# Patient Record
Sex: Male | Born: 1960 | Race: Black or African American | Hispanic: No | Marital: Married | Smoking: Current every day smoker
Health system: Southern US, Community
[De-identification: ages and names within clinical notes are randomized; demographics above are authoritative.]

## PROBLEM LIST (undated history)

## (undated) DIAGNOSIS — I10 Essential (primary) hypertension: Secondary | ICD-10-CM

---

## 2006-03-21 ENCOUNTER — Emergency Department (HOSPITAL_COMMUNITY): Admission: EM | Admit: 2006-03-21 | Discharge: 2006-03-21 | Payer: Self-pay | Admitting: Emergency Medicine

## 2010-09-10 ENCOUNTER — Emergency Department (HOSPITAL_COMMUNITY): Admission: EM | Admit: 2010-09-10 | Discharge: 2010-09-10 | Payer: Self-pay | Admitting: Emergency Medicine

## 2013-11-07 ENCOUNTER — Encounter (HOSPITAL_COMMUNITY): Payer: Self-pay | Admitting: Emergency Medicine

## 2013-11-07 ENCOUNTER — Emergency Department (HOSPITAL_COMMUNITY)
Admission: EM | Admit: 2013-11-07 | Discharge: 2013-11-07 | Disposition: A | Discharge status: Home / Self Care | Payer: BC Managed Care – PPO | Source: Home / Self Care

## 2013-11-07 DIAGNOSIS — R51 Headache: Secondary | ICD-10-CM

## 2013-11-07 DIAGNOSIS — I10 Essential (primary) hypertension: Secondary | ICD-10-CM

## 2013-11-07 LAB — POCT I-STAT, CHEM 8
BUN: 6 mg/dL (ref 6–23)
Glucose, Bld: 101 mg/dL — ABNORMAL HIGH (ref 70–99)
HCT: 50 % (ref 39.0–52.0)
Hemoglobin: 17 g/dL (ref 13.0–17.0)
Potassium: 4.1 mEq/L (ref 3.5–5.1)
Sodium: 141 mEq/L (ref 135–145)

## 2013-11-07 MED ORDER — HYDROCHLOROTHIAZIDE 25 MG PO TABS: 25.0000 mg | ORAL_TABLET | Freq: Every day | ORAL | Status: DC

## 2013-11-07 NOTE — ED Provider Notes (Signed)
CSN: [id number]     Arrival date & time 11/07/13  1209 History   First MD Initiated Contact with Patient 11/07/13 [phone]     Chief Complaint  Patient presents with  . Hypertension   (Consider location/radiation/quality/duration/timing/severity/associated sxs/prior Treatment) HPI Comments: 52 y o M with a hx of HTN and recently told by dentist needs to obtain a PCP for management. Instead, went to [facility] today and told that they cannot tx his BP.  His sx's include mild, dull H/A and pressure behind eyes.  Denies Problems with V,S,H,S, diplopia, focal weakness or numbness, chest pain, dyspnea or other.    History reviewed. No pertinent past medical history. History reviewed. No pertinent past surgical history. History reviewed. No pertinent family history. History  Substance Use Topics  . Smoking status: Current Every Day Smoker  . Smokeless tobacco: Not on file  . Alcohol Use: Yes    Review of Systems  Constitutional: Negative for fever, diaphoresis, activity change, appetite change and fatigue.  Respiratory: Negative.  Negative for cough, shortness of breath and wheezing.   Cardiovascular: Negative.  Negative for chest pain, palpitations and leg swelling.  Gastrointestinal: Negative.   Genitourinary: Negative.   Skin: Negative for rash.  Neurological: Positive for headaches. Negative for dizziness, seizures, syncope and speech difficulty.  Psychiatric/Behavioral: Negative.     Allergies  Review of patient's allergies indicates no known allergies.  Home Medications   Current Outpatient Rx  Name  Route  Sig  Dispense  Refill  . hydrochlorothiazide (HYDRODIURIL) 25 MG tablet   Oral   Take 1 tablet (25 mg total) by mouth daily.   30 tablet   0    BP 192/108  Pulse 72  Temp(Src) 98.6 F (37 C) (Oral)  Resp 16  SpO2 100% Physical Exam  Nursing note and vitals reviewed. Constitutional: He is oriented to person, place, and time. He appears well-developed and  well-nourished. No distress.  HENT:  Head: Normocephalic and atraumatic.  Mouth/Throat: Oropharynx is clear and moist. No oropharyngeal exudate.  Eyes: Conjunctivae and EOM are normal.  Neck: Normal range of motion. Neck supple.  Cardiovascular: Normal rate, regular rhythm and normal heart sounds.   Pulmonary/Chest: Effort normal and breath sounds normal. No respiratory distress. He has no wheezes.  Musculoskeletal: Normal range of motion. He exhibits no edema and no tenderness.  Lymphadenopathy:    He has no cervical adenopathy.  Neurological: He is alert and oriented to person, place, and time. He has normal strength. He displays no tremor. No cranial nerve deficit or sensory deficit. He exhibits normal muscle tone. Coordination normal.  Skin: Skin is warm and dry. No rash noted.  Psychiatric: He has a normal mood and affect.    ED Course  Procedures (including critical care time) Labs Review Labs Reviewed  POCT I-STAT, CHEM 8 - Abnormal; Notable for the following:    Glucose, Bld 101 (*)    Calcium, Ion 1.41 (*)    All other components within normal limits   Imaging Review No results found.    MDM   1. HTN (hypertension)   2. Headache      Call to obtain a PCP as soon as possible HCTZ 25 mg q d. Pt told we do not have sufficient data under recent criteria to provide optimal assessment for decision on BP meds. There are adverse effects and needs a physical and more labs. Neuro and card exam unremarkable. Take BP's, record and take to your doctor.  Hayden Rasmussen, NP 11/07/13 [phone]

## 2013-11-07 NOTE — ED Provider Notes (Signed)
Medical screening examination/treatment/procedure(s) were performed by non-physician practitioner and as supervising physician I was immediately available for consultation/collaboration.  Leslee Home, M.D.  Reuben Likes, MD 11/07/13 [phone]

## 2013-11-07 NOTE — ED Notes (Signed)
Pt   Reports  He  Has  Had    Some  Headache               And  Pressure  Behind   Eyes  X  sev  Weeks  He  Reports  He  Checked  His  bp recently  And  It was  Elevated  -     He  denys  Any  History  Of  htn  And  He      denys  Any chest pain /  Shortness of  Breath

## 2014-04-07 ENCOUNTER — Encounter (INDEPENDENT_AMBULATORY_CARE_PROVIDER_SITE_OTHER): Payer: Self-pay | Admitting: Ophthalmology

## 2014-04-14 ENCOUNTER — Encounter (INDEPENDENT_AMBULATORY_CARE_PROVIDER_SITE_OTHER): Payer: BC Managed Care – PPO | Admitting: Ophthalmology

## 2014-04-14 DIAGNOSIS — H35039 Hypertensive retinopathy, unspecified eye: Secondary | ICD-10-CM

## 2014-04-14 DIAGNOSIS — H30119 Disseminated chorioretinal inflammation of posterior pole, unspecified eye: Secondary | ICD-10-CM

## 2014-04-14 DIAGNOSIS — H35359 Cystoid macular degeneration, unspecified eye: Secondary | ICD-10-CM

## 2014-04-14 DIAGNOSIS — I10 Essential (primary) hypertension: Secondary | ICD-10-CM

## 2019-02-26 ENCOUNTER — Encounter (HOSPITAL_COMMUNITY): Payer: Self-pay

## 2019-02-26 ENCOUNTER — Inpatient Hospital Stay (HOSPITAL_COMMUNITY)
Admission: EM | Admit: 2019-02-26 | Discharge: 2019-03-06 | DRG: 439 | Disposition: A | Discharge status: Home / Self Care | Payer: Self-pay | Source: Ambulatory Visit | Attending: Family Medicine | Admitting: Family Medicine

## 2019-02-26 ENCOUNTER — Emergency Department (HOSPITAL_COMMUNITY): Payer: Self-pay

## 2019-02-26 ENCOUNTER — Ambulatory Visit (INDEPENDENT_AMBULATORY_CARE_PROVIDER_SITE_OTHER): Payer: Self-pay

## 2019-02-26 ENCOUNTER — Ambulatory Visit (HOSPITAL_COMMUNITY)
Admission: EM | Admit: 2019-02-26 | Discharge: 2019-02-26 | Disposition: A | Discharge status: Home / Self Care | Payer: Self-pay | Attending: Internal Medicine | Admitting: Internal Medicine

## 2019-02-26 ENCOUNTER — Other Ambulatory Visit: Payer: Self-pay

## 2019-02-26 DIAGNOSIS — K259 Gastric ulcer, unspecified as acute or chronic, without hemorrhage or perforation: Secondary | ICD-10-CM

## 2019-02-26 DIAGNOSIS — E860 Dehydration: Secondary | ICD-10-CM | POA: Diagnosis present

## 2019-02-26 DIAGNOSIS — F1020 Alcohol dependence, uncomplicated: Secondary | ICD-10-CM | POA: Diagnosis present

## 2019-02-26 DIAGNOSIS — R634 Abnormal weight loss: Secondary | ICD-10-CM

## 2019-02-26 DIAGNOSIS — K858 Other acute pancreatitis without necrosis or infection: Secondary | ICD-10-CM

## 2019-02-26 DIAGNOSIS — R64 Cachexia: Secondary | ICD-10-CM | POA: Diagnosis present

## 2019-02-26 DIAGNOSIS — R109 Unspecified abdominal pain: Secondary | ICD-10-CM

## 2019-02-26 DIAGNOSIS — K852 Alcohol induced acute pancreatitis without necrosis or infection: Principal | ICD-10-CM | POA: Diagnosis present

## 2019-02-26 DIAGNOSIS — H5461 Unqualified visual loss, right eye, normal vision left eye: Secondary | ICD-10-CM | POA: Diagnosis present

## 2019-02-26 DIAGNOSIS — Z833 Family history of diabetes mellitus: Secondary | ICD-10-CM

## 2019-02-26 DIAGNOSIS — R1010 Upper abdominal pain, unspecified: Secondary | ICD-10-CM

## 2019-02-26 DIAGNOSIS — Z841 Family history of disorders of kidney and ureter: Secondary | ICD-10-CM

## 2019-02-26 DIAGNOSIS — R509 Fever, unspecified: Secondary | ICD-10-CM

## 2019-02-26 DIAGNOSIS — K5900 Constipation, unspecified: Secondary | ICD-10-CM | POA: Diagnosis not present

## 2019-02-26 DIAGNOSIS — Z681 Body mass index (BMI) 19 or less, adult: Secondary | ICD-10-CM

## 2019-02-26 DIAGNOSIS — I4719 Other supraventricular tachycardia: Secondary | ICD-10-CM | POA: Diagnosis present

## 2019-02-26 DIAGNOSIS — K859 Acute pancreatitis without necrosis or infection, unspecified: Secondary | ICD-10-CM

## 2019-02-26 DIAGNOSIS — R0600 Dyspnea, unspecified: Secondary | ICD-10-CM

## 2019-02-26 DIAGNOSIS — I471 Supraventricular tachycardia, unspecified: Secondary | ICD-10-CM | POA: Diagnosis present

## 2019-02-26 DIAGNOSIS — E278 Other specified disorders of adrenal gland: Secondary | ICD-10-CM

## 2019-02-26 DIAGNOSIS — H538 Other visual disturbances: Secondary | ICD-10-CM | POA: Diagnosis present

## 2019-02-26 DIAGNOSIS — N179 Acute kidney failure, unspecified: Secondary | ICD-10-CM

## 2019-02-26 DIAGNOSIS — D8683 Sarcoid iridocyclitis: Secondary | ICD-10-CM | POA: Diagnosis present

## 2019-02-26 DIAGNOSIS — H5010 Unspecified exotropia: Secondary | ICD-10-CM | POA: Diagnosis present

## 2019-02-26 DIAGNOSIS — E871 Hypo-osmolality and hyponatremia: Secondary | ICD-10-CM | POA: Diagnosis present

## 2019-02-26 DIAGNOSIS — I4892 Unspecified atrial flutter: Secondary | ICD-10-CM

## 2019-02-26 DIAGNOSIS — I248 Other forms of acute ischemic heart disease: Secondary | ICD-10-CM | POA: Diagnosis present

## 2019-02-26 DIAGNOSIS — I10 Essential (primary) hypertension: Secondary | ICD-10-CM | POA: Diagnosis present

## 2019-02-26 DIAGNOSIS — E876 Hypokalemia: Secondary | ICD-10-CM | POA: Diagnosis not present

## 2019-02-26 DIAGNOSIS — I483 Typical atrial flutter: Secondary | ICD-10-CM | POA: Diagnosis present

## 2019-02-26 DIAGNOSIS — F17200 Nicotine dependence, unspecified, uncomplicated: Secondary | ICD-10-CM | POA: Diagnosis present

## 2019-02-26 DIAGNOSIS — D72829 Elevated white blood cell count, unspecified: Secondary | ICD-10-CM

## 2019-02-26 HISTORY — DX: Essential (primary) hypertension: I10

## 2019-02-26 LAB — COMPREHENSIVE METABOLIC PANEL
ALT: 56 U/L — ABNORMAL HIGH (ref 0–44)
AST: 153 U/L — ABNORMAL HIGH (ref 15–41)
Albumin: 3.1 g/dL — ABNORMAL LOW (ref 3.5–5.0)
Alkaline Phosphatase: 103 U/L (ref 38–126)
Anion gap: 16 — ABNORMAL HIGH (ref 5–15)
BUN: 79 mg/dL — ABNORMAL HIGH (ref 6–20)
CO2: 24 mmol/L (ref 22–32)
Calcium: 10.8 mg/dL — ABNORMAL HIGH (ref 8.9–10.3)
Chloride: 91 mmol/L — ABNORMAL LOW (ref 98–111)
Creatinine, Ser: 4.2 mg/dL — ABNORMAL HIGH (ref 0.61–1.24)
GFR calc Af Amer: 17 mL/min — ABNORMAL LOW (ref >60)
GFR calc non Af Amer: 15 mL/min — ABNORMAL LOW (ref >60)
Glucose, Bld: 184 mg/dL — ABNORMAL HIGH (ref 70–99)
Potassium: 4 mmol/L (ref 3.5–5.1)
Sodium: 131 mmol/L — ABNORMAL LOW (ref 135–145)
Total Bilirubin: 2.5 mg/dL — ABNORMAL HIGH (ref 0.3–1.2)
Total Protein: 8.4 g/dL — ABNORMAL HIGH (ref 6.5–8.1)

## 2019-02-26 LAB — CBC WITH DIFFERENTIAL/PLATELET
Abs Immature Granulocytes: 0 10*3/uL (ref 0.00–0.07)
Basophils Absolute: 0 10*3/uL (ref 0.0–0.1)
Basophils Relative: 0 %
Eosinophils Absolute: 0 10*3/uL (ref 0.0–0.5)
Eosinophils Relative: 0 %
HCT: 46.9 % (ref 39.0–52.0)
Hemoglobin: 16.6 g/dL (ref 13.0–17.0)
Lymphocytes Relative: 2 %
Lymphs Abs: 0.4 10*3/uL — ABNORMAL LOW (ref 0.7–4.0)
MCH: 34.6 pg — ABNORMAL HIGH (ref 26.0–34.0)
MCHC: 35.4 g/dL (ref 30.0–36.0)
MCV: 97.7 fL (ref 80.0–100.0)
Monocytes Absolute: 0.9 10*3/uL (ref 0.1–1.0)
Monocytes Relative: 5 %
Neutro Abs: 16.6 10*3/uL — ABNORMAL HIGH (ref 1.7–7.7)
Neutrophils Relative %: 93 %
Platelets: 283 10*3/uL (ref 150–400)
RBC: 4.8 MIL/uL (ref 4.22–5.81)
RDW: 13.2 % (ref 11.5–15.5)
WBC: 17.9 10*3/uL — ABNORMAL HIGH (ref 4.0–10.5)
nRBC: 0 /100 WBC
nRBC: 0.4 % — ABNORMAL HIGH (ref 0.0–0.2)

## 2019-02-26 LAB — URINALYSIS, COMPLETE (UACMP) WITH MICROSCOPIC
Bilirubin Urine: NEGATIVE
Glucose, UA: NEGATIVE mg/dL
Ketones, ur: 5 mg/dL — AB
Leukocytes,Ua: NEGATIVE
Nitrite: NEGATIVE
Protein, ur: NEGATIVE mg/dL
Specific Gravity, Urine: 1.015 (ref 1.005–1.030)
pH: 5 (ref 5.0–8.0)

## 2019-02-26 LAB — LIPASE, BLOOD
Lipase: 37 U/L (ref 11–51)
Lipase: 29 U/L (ref 11–51)

## 2019-02-26 LAB — TROPONIN I
Troponin I: 0.06 ng/mL (ref <0.03)
Troponin I: 0.04 ng/mL (ref <0.03)
Troponin I: 0.05 ng/mL (ref <0.03)

## 2019-02-26 LAB — AMYLASE: Amylase: 71 U/L (ref 28–100)

## 2019-02-26 MED ORDER — ONDANSETRON HCL 4 MG/2ML IJ SOLN: 4.0000 mg | Freq: Four times a day (QID) | INTRAMUSCULAR | Status: DC | PRN

## 2019-02-26 MED ORDER — ADULT MULTIVITAMIN W/MINERALS CH
1.0000 | ORAL_TABLET | Freq: Every day | ORAL | Status: DC
  Administered 2019-02-27 – 2019-03-06 (×8): 1 via ORAL
  Filled 2019-02-26 (×9): qty 1

## 2019-02-26 MED ORDER — SODIUM CHLORIDE 0.9 % IV SOLN
INTRAVENOUS | Status: DC
  Administered 2019-02-26 – 2019-02-28 (×4): via INTRAVENOUS

## 2019-02-26 MED ORDER — ACETAMINOPHEN 650 MG RE SUPP: 650.0000 mg | Freq: Four times a day (QID) | RECTAL | Status: DC | PRN

## 2019-02-26 MED ORDER — PANTOPRAZOLE SODIUM 40 MG IV SOLR
40.0000 mg | Freq: Two times a day (BID) | INTRAVENOUS | Status: DC
  Administered 2019-02-26 – 2019-02-28 (×4): 40 mg via INTRAVENOUS
  Filled 2019-02-26 (×4): qty 40

## 2019-02-26 MED ORDER — DILTIAZEM LOAD VIA INFUSION
20.0000 mg | Freq: Once | INTRAVENOUS | Status: AC
  Administered 2019-02-26: 20 mg via INTRAVENOUS
  Filled 2019-02-26: qty 20

## 2019-02-26 MED ORDER — DILTIAZEM HCL-DEXTROSE 100-5 MG/100ML-% IV SOLN (PREMIX)
5.0000 mg/h | INTRAVENOUS | Status: DC
  Administered 2019-02-26: 18:00:00 5 mg/h via INTRAVENOUS
  Administered 2019-02-27: 15 mg/h via INTRAVENOUS
  Filled 2019-02-26 (×3): qty 100

## 2019-02-26 MED ORDER — ENOXAPARIN SODIUM 30 MG/0.3ML ~~LOC~~ SOLN
30.0000 mg | SUBCUTANEOUS | Status: DC
  Administered 2019-02-27: 30 mg via SUBCUTANEOUS
  Filled 2019-02-26: qty 0.3

## 2019-02-26 MED ORDER — ONDANSETRON HCL 4 MG PO TABS: 4.0000 mg | ORAL_TABLET | Freq: Four times a day (QID) | ORAL | Status: DC | PRN

## 2019-02-26 MED ORDER — THIAMINE HCL 100 MG/ML IJ SOLN
100.0000 mg | Freq: Every day | INTRAMUSCULAR | Status: DC
  Administered 2019-02-26: 100 mg via INTRAVENOUS
  Filled 2019-02-26: qty 2

## 2019-02-26 MED ORDER — DILTIAZEM HCL-DEXTROSE 100-5 MG/100ML-% IV SOLN (PREMIX)
5.0000 mg/h | INTRAVENOUS | Status: DC
  Filled 2019-02-26: qty 100

## 2019-02-26 MED ORDER — VITAMIN B-1 100 MG PO TABS
100.0000 mg | ORAL_TABLET | Freq: Every day | ORAL | Status: DC
  Administered 2019-02-27 – 2019-03-06 (×8): 100 mg via ORAL
  Filled 2019-02-26 (×8): qty 1

## 2019-02-26 MED ORDER — DILTIAZEM HCL 100 MG IV SOLR
5.0000 mg/h | INTRAVENOUS | Status: DC
  Filled 2019-02-26 (×2): qty 100

## 2019-02-26 MED ORDER — ACETAMINOPHEN 325 MG PO TABS
650.0000 mg | ORAL_TABLET | Freq: Four times a day (QID) | ORAL | Status: DC | PRN
  Administered 2019-02-28 – 2019-03-03 (×3): 650 mg via ORAL
  Filled 2019-02-26 (×3): qty 2

## 2019-02-26 MED ORDER — FOLIC ACID 1 MG PO TABS
1.0000 mg | ORAL_TABLET | Freq: Every day | ORAL | Status: DC
  Administered 2019-02-27 – 2019-03-06 (×8): 1 mg via ORAL
  Filled 2019-02-26 (×9): qty 1

## 2019-02-26 MED ORDER — SODIUM CHLORIDE 0.9 % IV BOLUS
1000.0000 mL | Freq: Once | INTRAVENOUS | Status: AC
  Administered 2019-02-26: 1000 mL via INTRAVENOUS

## 2019-02-26 NOTE — ED Notes (Signed)
Patient back from CT in stable condition. VS WDL. Call light in reach. Patient sleeping.

## 2019-02-26 NOTE — ED Triage Notes (Signed)
Pt arrives from Urgent care for further evaluation. Pt reports feeling weak, having abdominal pain, and having a decreased appetite. Per d/c paperwork, pt needs evaluation for possible dehydration and acute kidney failure.

## 2019-02-26 NOTE — Consult Note (Signed)
CONSULTATION NOTE   Patient Name: Joel Baker Date of Encounter: 02/26/2019 Cardiologist: No primary care provider on file.  Chief Complaint   Abdominal pain  Patient Profile   58 yo male with acute generalized abdominal pain and mild chest discomfort with palpitations, found to be in SVT.  HPI   Joel Baker is a 58 y.o. male who is being seen today for the evaluation of SVT and elevated troponin at the request of Dr. Sherry Ruffing. This is a 58 year old male with a history of hypertension who presented with acute onset upper abdominal pain, nausea, vomiting and diarrhea in addition to shortness of breath and fatigue.  He has had several days of symptoms including hiccups for the past 2 days.  In addition he is noted that his heart was palpitating today.  In an urgent care today and placed on a cardiac monitor noted to have a heart rate in the 180s, with a narrow complex tachycardia suggestive of atrial flutter with rapid ventricular response, however on closer inspection I suspect this is an SVT with aberrancy.  There also may be some anterolateral ST segment depression. He was placed on diltiazem and up to 15 mg/hr and shortly before I saw him in the emergency department he had converted to a sinus tachycardia.  He reported that he felt somewhat better in his chest but that his abdomen still hurt.  He was sent for CT scan of the abdomen which showed peripancreatic fluid and findings suggestive of acute pancreatitis.  PMHx   Past Medical History:  Diagnosis Date   Hypertension     History reviewed. No pertinent surgical history.  FAMHx   History reviewed. No pertinent family history.  SOCHx    reports that he has been smoking. He has never used smokeless tobacco. He reports current alcohol use. He reports current drug use. Drug: Marijuana.  Outpatient Medications   No current facility-administered medications on file prior to encounter.    Current Outpatient Medications on File  Prior to Encounter  Medication Sig Dispense Refill   hydrochlorothiazide (HYDRODIURIL) 25 MG tablet Take 1 tablet (25 mg total) by mouth daily. 30 tablet 0    Inpatient Medications    Scheduled Meds:   Continuous Infusions:  diltiazem (CARDIZEM) infusion 10 mg/hr (02/26/19 1804)    PRN Meds:    ALLERGIES   No Known Allergies  ROS   Pertinent items noted in HPI and remainder of comprehensive ROS otherwise negative.  Vitals   Vitals:   02/26/19 1930 02/26/19 1945 02/26/19 2000 02/26/19 2030  BP: 119/82 (!) 116/91 125/90 (!) 127/97  Pulse: (!) 108 (!) 101  (!) 104  Resp: (!) 22 15 18 14   Temp:      TempSrc:      SpO2: 99% 98%  100%  Weight:      Height:        Intake/Output Summary (Last 24 hours) at 02/26/2019 2059 Last data filed at 02/26/2019 1813 Gross per 24 hour  Intake 1000 ml  Output --  Net 1000 ml   Filed Weights   02/26/19 1715  Weight: 54.5 kg    Physical Exam   General appearance: alert, appears older than stated age, cachectic and mild distress Neck: no carotid bruit, no JVD and thyroid not enlarged, symmetric, no tenderness/mass/nodules Lungs: diminished breath sounds bibasilar Heart: Regular tachycardia, no murmur Abdomen: Mild midepigastric tenderness to palpation with involuntary guarding, no rebound Extremities: extremities normal, atraumatic, no cyanosis or edema Pulses: 2+ and symmetric  Skin: Warm, dry Neurologic: Mental status: Alert, oriented, thought content appropriate Psych: Pleasant  Labs   Results for orders placed or performed during the hospital encounter of 02/26/19 (from the past 48 hour(s))  Troponin I - ONCE - STAT     Status: Abnormal   Collection Time: 02/26/19  5:37 PM  Result Value Ref Range   Troponin I 0.06 (HH) <0.03 ng/mL    Comment: CRITICAL RESULT CALLED TO, READ BACK BY AND VERIFIED WITH: Earl Gala 1823 02/26/2019 WBOND Performed at [facility], [place] [address], [place], [place] [address]      ECG   Initial EKG appears to be atrial flutter with 2:1 conduction and accelerated rate, anterolateral ST depression- Personally Reviewed  Telemetry   Narrow complex tachycardia with ST depression anterolaterally, resolved to a sinus rhythm/sinus tachycardia- Personally Reviewed  Radiology   Ct Abdomen Pelvis Wo Contrast  Result Date: 02/26/2019 CLINICAL DATA:  58 y/o  M; nausea, vomiting, abdominal pain. EXAM: CT ABDOMEN AND PELVIS WITHOUT CONTRAST TECHNIQUE: Multidetector CT imaging of the abdomen and pelvis was performed following the standard protocol without IV contrast. COMPARISON:  None. FINDINGS: Lower chest: No acute abnormality. Hepatobiliary: No focal liver abnormality is seen. No gallstones, gallbladder wall thickening, or biliary dilatation. Pancreas: Extensive edema surrounding the pancreas, throughout the retroperitoneum and into the mesentery no main duct dilatation or hypoattenuation of the pancreas. There is a fluid collection which extends along greater curvature of the stomach and the body of the pancreas spanning up to 8 cm (series 3, image 16 and series 6, image 45). Spleen: Normal in size without focal abnormality. Adrenals/Urinary Tract: Left adrenal mass measuring 3.5 cm and 18 HU (series 3, image 22). Normal right adrenal gland. No focal kidney lesion identified. No urinary stone disease or hydronephrosis. Normal bladder. Stomach/Bowel: Stomach appears unremarkable on this noncontrast examination. Appendix appears normal. No evidence of bowel wall thickening, distention, or inflammatory changes. Vascular/Lymphatic: Aortic atherosclerosis. No enlarged abdominal or pelvic lymph nodes. Reproductive: Prostate is unremarkable. Other: No abdominal wall hernia. Musculoskeletal: No fracture is seen. IMPRESSION: 1. Extensive edema surrounding the pancreas, left posterior retroperitoneum, and mesentery. Fluid collection along greater curvature of stomach and body of pancreas.  Findings favor acute pancreatitis with acute peripancreatic collection, less likely penetrating gastric ulcer. 2. 3.5 cm indeterminate left adrenal mass. Further characterization with adrenal protocol CT or MRI is recommended on a nonemergent basis. This recommendation follows ACR consensus guidelines: Management of Incidental Adrenal Masses: A White Paper of the ACR Incidental Findings Committee. J Am Coll Radiol 2017;14:1038-1044. Electronically Signed   By: Kristine Garbe M.D.   On: 02/26/2019 20:31   Dg Chest Portable 1 View  Result Date: 02/26/2019 CLINICAL DATA:  Shortness of breath.  Elevated heart rate. EXAM: PORTABLE CHEST 1 VIEW COMPARISON:  02/26/2019 acute abdomen series FINDINGS: Hyperinflation. Midline trachea. Normal heart size and mediastinal contours. No pleural effusion or pneumothorax. Possible 8 mm nodular density projecting over the right lung base. Numerous leads and wires project over the chest. IMPRESSION: Hyperinflation, without acute disease. Possible nodular density projecting over the right lung base. Consider PA and lateral radiographs after removal of all leads and wires. Electronically Signed   By: Abigail Miyamoto M.D.   On: 02/26/2019 18:14   Dg Abd Acute W/chest  Result Date: 02/26/2019 CLINICAL DATA:  Shortness of breath, abdominal pain, diarrhea. EXAM: DG ABDOMEN ACUTE W/ 1V CHEST COMPARISON:  None. FINDINGS: There is no evidence of dilated bowel loops or free  intraperitoneal air. No radiopaque calculi or other significant radiographic abnormality is seen. Heart size and mediastinal contours are within normal limits. Both lungs are clear. IMPRESSION: No evidence of bowel obstruction or ileus. No acute cardiopulmonary disease. Electronically Signed   By: Marijo Conception M.D.   On: 02/26/2019 15:41    Cardiac Studies   None  Impression   Principal Problem:   Acute pancreatitis Active Problems:   Narrow complex tachycardia (HCC)   Essential  hypertension   Recommendation   1. Mr. Harpole had a narrow complex tachycardia which initially was concerning for atrial flutter however appears to be more likely an SVT with some aberrancy and possible anterolateral ST depression.  Initial point-of-care troponin was 0.06.  I would trend this and suspect it may increase further.  He does have a leukocytosis probably related to his acute pancreatitis.  AST and ALT are also elevated.  Would recommend continuing diltiazem infusion for now as he should remain n.p.o. for his pancreatitis.  Thanks for the consultation.  Cardiology will follow with you.  Time Spent Directly with Patient:  I have spent a total of 45 minutes with the patient reviewing hospital notes, telemetry, EKGs, labs and examining the patient as well as establishing an assessment and plan that was discussed personally with the patient.  > 50% of time was spent in direct patient care.  Length of Stay:  LOS: 0 days   Pixie Casino, MD, [facility], [place] Director of the [facility]  [facility] Diplomate of the American Board of Clinical Lipidology Attending Cardiologist  Direct Dial: [phone]   Fax: [phone]  Website:  [email or web address] Jase [email or web address] 02/26/2019, 8:59 PM

## 2019-02-26 NOTE — Discharge Instructions (Addendum)
Please go to the ER for further evaluation and management.  Your x ray was normal but your WBC count is high meaning you have some sort of infection.  Your other lab worked showed some dehydration and acute kidney failure.  You need fluids and possible antibiotics Possibly even CT scan

## 2019-02-26 NOTE — ED Notes (Signed)
ED Provider at bedside. 

## 2019-02-26 NOTE — ED Notes (Signed)
HR continues to be in 180's, maxed out on Dilt gtt. Repeat EKG completed and given to MD Tegeler.

## 2019-02-26 NOTE — ED Provider Notes (Signed)
[facility]    CSN: [id number] Arrival date & time: 02/26/19  1337     History   Chief Complaint Chief Complaint  Patient presents with  . multiple complaints    HPI Joel Baker is a 58 y.o. male.   Patient is a 58 year old male with past medical history of hypertension and glaucoma.  He presents today with waxing and waning upper abdominal discomfort, hiccups, loss of appetite, vomiting, diarrhea and mild shortness of breath.  This is been increasingly worsening over the past month.  Reports that the discomfort is somewhat relieved after vomiting.  He has only had an appetite to eat fruit.  Unsure of last bowel movement but believes it to be approximately 2 weeks ago. Some decreased urination despite drinking lots of water.   He has had some weight loss.  He has been taking Rolaids without much relief of his discomfort.  He is a current everyday smoker.  Denies any fevers, cough congestion or lower extremity edema.  He admits to being a heavy drinker of beer and wine.  He has not had a drink in almost a week.  ROS per HPI      History reviewed. No pertinent past medical history.  There are no active problems to display for this patient.   History reviewed. No pertinent surgical history.     Home Medications    Prior to Admission medications   Medication Sig Start Date End Date Taking? Authorizing Provider  hydrochlorothiazide (HYDRODIURIL) 25 MG tablet Take 1 tablet (25 mg total) by mouth daily. 11/07/13   Janne Napoleon, NP    Family History History reviewed. No pertinent family history.  Social History Social History   Tobacco Use  . Smoking status: Current Every Day Smoker  Substance Use Topics  . Alcohol use: Yes    Comment: daily  . Drug use: Yes    Types: Marijuana    Comment: every other day     Allergies   Patient has no known allergies.   Review of Systems Review of Systems   Physical Exam Triage Vital Signs ED Triage Vitals   Enc Vitals Group     BP 02/26/19 1428 100/65     Pulse Rate 02/26/19 1428 89     Resp 02/26/19 1428 19     Temp 02/26/19 1428 98.3 F (36.8 C)     Temp Source 02/26/19 1428 Oral     SpO2 02/26/19 1428 100 %     Weight 02/26/19 1526 120 lb 3.2 oz (54.5 kg)     Height 02/26/19 1526 5\' 9"  (1.753 m)     Head Circumference --      Peak Flow --      Pain Score 02/26/19 1426 0     Pain Loc --      Pain Edu? --      Excl. in [place]? --    No data found.  Updated Vital Signs BP 100/65 (BP Location: Right Arm)   Pulse 89   Temp 98.3 F (36.8 C) (Oral)   Resp 19   Ht 5\' 9"  (1.753 m)   Wt 120 lb 3.2 oz (54.5 kg)   SpO2 100%   BMI 17.75 kg/m   Visual Acuity Right Eye Distance:   Left Eye Distance:   Bilateral Distance:    Right Eye Near:   Left Eye Near:    Bilateral Near:     Physical Exam Vitals signs and nursing note reviewed.  Constitutional:  Appearance: He is ill-appearing.     Comments: Emaciated  HENT:     Head: Normocephalic and atraumatic.     Nose: Nose normal.  Neck:     Musculoskeletal: Normal range of motion.  Cardiovascular:     Rate and Rhythm: Normal rate and regular rhythm.  Pulmonary:     Effort: Pulmonary effort is normal.     Breath sounds: Normal breath sounds.  Abdominal:     General: There is no distension.     Palpations: Abdomen is soft.     Tenderness: There is abdominal tenderness. There is guarding.     Comments: Diffuse tenderness in the right and left upper quadrant  Musculoskeletal: Normal range of motion.  Skin:    General: Skin is warm and dry.  Psychiatric:        Mood and Affect: Mood normal.      UC Treatments / Results  Labs (all labs ordered are listed, but only abnormal results are displayed) Labs Reviewed  COMPREHENSIVE METABOLIC PANEL - Abnormal; Notable for the following components:      Result Value   Sodium 131 (*)    Chloride 91 (*)    Glucose, Bld 184 (*)    BUN 79 (*)    Creatinine, Ser 4.20 (*)     Calcium 10.8 (*)    Total Protein 8.4 (*)    Albumin 3.1 (*)    AST 153 (*)    ALT 56 (*)    Total Bilirubin 2.5 (*)    GFR calc non Af Amer 15 (*)    GFR calc Af Amer 17 (*)    Anion gap 16 (*)    All other components within normal limits  CBC WITH DIFFERENTIAL/PLATELET - Abnormal; Notable for the following components:   WBC 17.9 (*)    MCH 34.6 (*)    nRBC 0.4 (*)    Neutro Abs 16.6 (*)    Lymphs Abs 0.4 (*)    All other components within normal limits  LIPASE, BLOOD  AMYLASE    EKG None  Radiology Dg Abd Acute W/chest  Result Date: 02/26/2019 CLINICAL DATA:  Shortness of breath, abdominal pain, diarrhea. EXAM: DG ABDOMEN ACUTE W/ 1V CHEST COMPARISON:  None. FINDINGS: There is no evidence of dilated bowel loops or free intraperitoneal air. No radiopaque calculi or other significant radiographic abnormality is seen. Heart size and mediastinal contours are within normal limits. Both lungs are clear. IMPRESSION: No evidence of bowel obstruction or ileus. No acute cardiopulmonary disease. Electronically Signed   By: Marijo Conception M.D.   On: 02/26/2019 15:41    Procedures Procedures (including critical care time)  Medications Ordered in UC Medications - No data to display  Initial Impression / Assessment and Plan / UC Course  I have reviewed the triage vital signs and the nursing notes.  Pertinent labs & imaging results that were available during my care of the patient were reviewed by me and considered in my medical decision making (see chart for details).     Patient is a 58 year old male with diffuse upper abdominal discomfort, nausea, vomiting, hiccups, weight loss, loss of appetite and shortness of breath. White blood cell count elevated at 17.9, neutrophils 16.6 Patient in acute kidney failure and dehydrated, GFR 15, 17 and creatinine 4.2.  X-ray normal Liver enzymes slightly elevated but lipase and amylase WNL.  Sending to the ER for further management of  rehydration and to find source of infection. Urine sample not  obtained here.  Patient understanding and agreed to plan   Final Clinical Impressions(s) / UC Diagnoses   Final diagnoses:  Pain of upper abdomen  Loss of weight     Discharge Instructions     Please go to the ER for further evaluation and management.  Your x ray was normal but your WBC count is high meaning you have some sort of infection.  Your other lab worked showed some dehydration and acute kidney failure.  You need fluids and possible antibiotics Possibly even CT scan    ED Prescriptions    None     Controlled Substance Prescriptions [place] Controlled Substance Registry consulted? Not Applicable   Orvan July, NP 02/26/19 1558

## 2019-02-26 NOTE — ED Provider Notes (Signed)
[facility] DEPARTMENT Provider Note   CSN: [id number] Arrival date & time: 02/26/19  1615    History   Chief Complaint Chief Complaint  Patient presents with  . Weakness    HPI Joel Baker is a 58 y.o. male with history of hypertension presents today for evaluation of acute onset, progressively worsening upper abdominal pain, nausea, vomiting, diarrhea, shortness of breath, fatigue for 6 days.  He reports that symptoms began 6 days ago with hiccups which lasted approximately 2 days.  He noted upper abdominal discomfort.  He has had several episodes of nonbloody nonbilious emesis over the last 6 days but none in the last 2.  Notes some diarrhea as well, nonbloody.  Denies chest pain, fevers, melena, or hematochezia.  He notes decreased urine output as well as decreased oral intake and reports he has only been able to tolerate a few pieces of fruit.  He reports feeling very fatigued during this time, lightheaded and short of breath with any activity.  He states that he smokes marijuana every few days, typically drinks alcohol daily but has not been able to drink any alcohol for the last week.  No aggravating or alleviating factors noted.  He has taken Rolaids with little relief of his symptoms.  He was seen and evaluated at the urgent care and sent to the ED for further evaluation.     The history is provided by the patient.    Past Medical History:  Diagnosis Date  . Hypertension     There are no active problems to display for this patient.   History reviewed. No pertinent surgical history.      Home Medications    Prior to Admission medications   Medication Sig Start Date End Date Taking? Authorizing Provider  hydrochlorothiazide (HYDRODIURIL) 25 MG tablet Take 1 tablet (25 mg total) by mouth daily. 11/07/13   Janne Napoleon, NP    Family History History reviewed. No pertinent family history.  Social History Social History   Tobacco Use  .  Smoking status: Current Every Day Smoker  . Smokeless tobacco: Never Used  Substance Use Topics  . Alcohol use: Yes    Comment: daily  . Drug use: Yes    Types: Marijuana    Comment: every other day     Allergies   Patient has no known allergies.   Review of Systems Review of Systems  Constitutional: Positive for fatigue. Negative for chills and fever.  Respiratory: Positive for shortness of breath. Negative for cough.   Cardiovascular: Negative for chest pain.  Gastrointestinal: Positive for abdominal pain, diarrhea, nausea and vomiting.  Genitourinary: Positive for decreased urine volume. Negative for dysuria and frequency.  Neurological: Positive for light-headedness. Negative for syncope.  All other systems reviewed and are negative.    Physical Exam Updated Vital Signs BP (!) 116/91   Pulse (!) 101   Temp 97.8 F (36.6 C) (Oral)   Resp 15   Ht 5\' 9"  (1.753 m)   Wt 54.5 kg   SpO2 98%   BMI 17.74 kg/m   Physical Exam Vitals signs and nursing note reviewed.  Constitutional:      General: He is not in acute distress.    Appearance: He is well-developed.     Comments: thin  HENT:     Head: Normocephalic and atraumatic.  Eyes:     General:        Right eye: No discharge.  Left eye: No discharge.     Conjunctiva/sclera: Conjunctivae normal.  Neck:     Musculoskeletal: Normal range of motion and neck supple.     Vascular: No JVD.     Trachea: No tracheal deviation.  Cardiovascular:     Rate and Rhythm: Regular rhythm. Tachycardia present.     Pulses: Normal pulses.  Pulmonary:     Effort: Pulmonary effort is normal.     Breath sounds: Normal breath sounds.  Abdominal:     General: Abdomen is flat. There is no distension.     Palpations: Abdomen is soft.     Tenderness: There is abdominal tenderness in the right upper quadrant, epigastric area and left upper quadrant. There is no guarding or rebound.     Comments: Mild discomfort on palpation of  the upper abdomen, Rovsing sign absent, Murphy sign absent.  Skin:    General: Skin is warm and dry.     Findings: No erythema.  Neurological:     Mental Status: He is alert.  Psychiatric:        Behavior: Behavior normal.      ED Treatments / Results  Labs (all labs ordered are listed, but only abnormal results are displayed) Labs Reviewed  TROPONIN I - Abnormal; Notable for the following components:      Result Value   Troponin I 0.06 (*)    All other components within normal limits  URINALYSIS, ROUTINE W REFLEX MICROSCOPIC  RAPID URINE DRUG SCREEN, HOSP PERFORMED  TROPONIN I    Recent Results (from the past 2160 hour(s))  Comprehensive metabolic panel     Status: Abnormal   Collection Time: 02/26/19  3:09 PM  Result Value Ref Range   Sodium 131 (L) 135 - 145 mmol/L   Potassium 4.0 3.5 - 5.1 mmol/L   Chloride 91 (L) 98 - 111 mmol/L   CO2 24 22 - 32 mmol/L   Glucose, Bld 184 (H) 70 - 99 mg/dL   BUN 79 (H) 6 - 20 mg/dL   Creatinine, Ser 4.20 (H) 0.61 - 1.24 mg/dL   Calcium 10.8 (H) 8.9 - 10.3 mg/dL   Total Protein 8.4 (H) 6.5 - 8.1 g/dL   Albumin 3.1 (L) 3.5 - 5.0 g/dL   AST 153 (H) 15 - 41 U/L   ALT 56 (H) 0 - 44 U/L   Alkaline Phosphatase 103 38 - 126 U/L   Total Bilirubin 2.5 (H) 0.3 - 1.2 mg/dL   GFR calc non Af Amer 15 (L) >60 mL/min   GFR calc Af Amer 17 (L) >60 mL/min   Anion gap 16 (H) 5 - 15    Comment: Performed at [facility], [address], [place], [place] [address]  CBC with Differential     Status: Abnormal   Collection Time: 02/26/19  3:09 PM  Result Value Ref Range   WBC 17.9 (H) 4.0 - 10.5 K/uL   RBC 4.80 4.22 - 5.81 MIL/uL   Hemoglobin 16.6 13.0 - 17.0 g/dL   HCT 46.9 39.0 - 52.0 %   MCV 97.7 80.0 - 100.0 fL   MCH 34.6 (H) 26.0 - 34.0 pg   MCHC 35.4 30.0 - 36.0 g/dL   RDW 13.2 11.5 - 15.5 %   Platelets 283 150 - 400 K/uL   nRBC 0.4 (H) 0.0 - 0.2 %   Neutrophils Relative % 93 %   Neutro Abs 16.6 (H) 1.7 - 7.7 K/uL   Lymphocytes  Relative 2 %  Lymphs Abs 0.4 (L) 0.7 - 4.0 K/uL   Monocytes Relative 5 %   Monocytes Absolute 0.9 0.1 - 1.0 K/uL   Eosinophils Relative 0 %   Eosinophils Absolute 0.0 0.0 - 0.5 K/uL   Basophils Relative 0 %   Basophils Absolute 0.0 0.0 - 0.1 K/uL   nRBC 0 0 /100 WBC   Abs Immature Granulocytes 0.00 0.00 - 0.07 K/uL    Comment: Performed at [facility], [place] [address], [place], [place] [address]  Lipase, blood     Status: None   Collection Time: 02/26/19  3:09 PM  Result Value Ref Range   Lipase 37 11 - 51 U/L    Comment: Performed at [facility], [place] [address], [place], [place] [address]  Amylase     Status: None   Collection Time: 02/26/19  3:09 PM  Result Value Ref Range   Amylase 71 28 - 100 U/L    Comment: Performed at [facility] [address] [place], [place] [address]  Troponin I - ONCE - STAT     Status: Abnormal   Collection Time: 02/26/19  5:37 PM  Result Value Ref Range   Troponin I 0.06 (HH) <0.03 ng/mL    Comment: CRITICAL RESULT CALLED TO, READ BACK BY AND VERIFIED WITH: Earl Gala 1823 02/26/2019 WBOND Performed at [facility], [place] [address], [place], [place] [address]       ED ECG REPORT   Date: 02/26/2019  Rate: 180  Rhythm: atrial flutter  QRS Axis: normal  Intervals: PR shortened  ST/T Wave abnormalities: indeterminate  Conduction Disutrbances:none  Narrative Interpretation:   Old EKG Reviewed: none available  I have personally reviewed the EKG tracing and agree with the computerized printout as noted.   Radiology Dg Chest Portable 1 View  Result Date: 02/26/2019 CLINICAL DATA:  Shortness of breath.  Elevated heart rate. EXAM: PORTABLE CHEST 1 VIEW COMPARISON:  02/26/2019 acute abdomen series FINDINGS: Hyperinflation. Midline trachea. Normal heart size and mediastinal contours. No pleural effusion or pneumothorax. Possible 8 mm nodular density projecting over the right lung base. Numerous leads and wires  project over the chest. IMPRESSION: Hyperinflation, without acute disease. Possible nodular density projecting over the right lung base. Consider PA and lateral radiographs after removal of all leads and wires. Electronically Signed   By: Abigail Miyamoto M.D.   On: 02/26/2019 18:14   Dg Abd Acute W/chest  Result Date: 02/26/2019 CLINICAL DATA:  Shortness of breath, abdominal pain, diarrhea. EXAM: DG ABDOMEN ACUTE W/ 1V CHEST COMPARISON:  None. FINDINGS: There is no evidence of dilated bowel loops or free intraperitoneal air. No radiopaque calculi or other significant radiographic abnormality is seen. Heart size and mediastinal contours are within normal limits. Both lungs are clear. IMPRESSION: No evidence of bowel obstruction or ileus. No acute cardiopulmonary disease. Electronically Signed   By: Marijo Conception M.D.   On: 02/26/2019 15:41    Procedures .Critical Care Performed by: Renita Papa, PA-C Authorized by: Renita Papa, PA-C   Critical care provider statement:    Critical care time (minutes):  40   Critical care was necessary to treat or prevent imminent or life-threatening deterioration of the following conditions:  Cardiac failure and renal failure   Critical care was time spent personally by me on the following activities:  Discussions with consultants, evaluation of patient's response to treatment, examination of patient, ordering and performing treatments and interventions, ordering and  review of laboratory studies, ordering and review of radiographic studies, pulse oximetry, re-evaluation of patient's condition, obtaining history from patient or surrogate and review of old charts   I assumed direction of critical care for this patient from another provider in my specialty: no     (including critical care time)  Medications Ordered in ED Medications  diltiazem (CARDIZEM) 100 mg in dextrose 5% 160mL (1 mg/mL) infusion (10 mg/hr Intravenous Rate/Dose Change 02/26/19 1804)  diltiazem  (CARDIZEM) 1 mg/mL load via infusion 20 mg (20 mg Intravenous Bolus from Bag 02/26/19 1749)  sodium chloride 0.9 % bolus 1,000 mL (0 mLs Intravenous Stopped 02/26/19 1813)     Initial Impression / Assessment and Plan / ED Course  I have reviewed the triage vital signs and the nursing notes.  Pertinent labs & imaging results that were available during my care of the patient were reviewed by me and considered in my medical decision making (see chart for details).        Patient presents for evaluation of shortness of breath, upper abdominal pain, nausea, vomiting, diarrhea, fatigue and lightheadedness for approximately 6 days.  He is afebrile in the ED, persistently tachycardic up to 190 bpm.  EKG suggestive of a flutter with rapid ventricular rate.  Will start Cardizem bolus and drip to attempt rate control.  His abdominal exam is benign with no significant tenderness or peritoneal signs.  Lab work obtained at urgent care significant for leukocytosis, elevated BUN and creatinine.  No recent labs to compare. 5:29PM Spoke with Dr. Margaretann Loveless with cardiology who has reviewed the patient's EKG.  Troponin is pending.  She recommends initiation of diltiazem and repeat EKG when his heart rate is slowed as he has some questionable ST depressions in leads V3 and V4. 7:06PM Spoke with Dr. Debara Pickett with cardiology.  Patient maxed out on diltiazem drip, heart rate remains in the 180s.  Initial troponin mildly elevated.  He remains resting comfortably in no apparent distress, no complaints of chest pain or palpitations.  Cardiology will see the patient emergently in the ED. 7:26 PM Received EKG from [facility]; patient spontaneously converted to NSR. 7:45PM Patient seen and evaluated by Dr. Debara Pickett with cardiology prior to being taken to [facility]. He recommends keeping the patient on cardizem drip until he can be transitioned to oral medications, hospitalist admission with cardiology consulting while admitted.   8:06 PM Patient pending CT abdomen/pelvis wo contrast, UA, UDS. Remains resting comfortably on reassessment, HR <105, normal sinus. Family medicine teaching service to admit. Serial abdominal exams remain benign. Patient seen and evaluated by Dr. Sherry Ruffing who agrees with assessment and plan at this time.   Final Clinical Impressions(s) / ED Diagnoses   Final diagnoses:  Atrial flutter with rapid ventricular response ([facility])  AKI (acute kidney injury) [facility])    ED Discharge Orders    None       Debroah Baller 02/26/19 2008    Tegeler, Gwenyth Allegra, MD 02/27/19 1035

## 2019-02-26 NOTE — ED Notes (Signed)
Date and time results received: 02/26/19 1822 (use smartphrase ".now" to insert current time)  Test: troponin Critical Value: 0.06  Name of Provider Notified: Harrell Gave Tegeler [id number]  Orders Received? Or Actions Taken?: Provider aware

## 2019-02-26 NOTE — H&P (Addendum)
[facility] Admission History and Physical Service Pager: [phone]  Patient name: Joel Baker Medical record number: [id number] Date of birth: 05-28-1961 Age: 58 y.o. Gender: male  Primary Care Provider: Patient, No Pcp Per Consultants: Cardiology Code Status: full code  Chief Complaint: SOB and decreased appetite   Assessment and Plan: Bergen [name] is a 58 y.o. male presenting with SVT and pancreatitis. PMH is significant for alcohol use disorder, hypertension, sarcoidosis.  Supraventricular tachycardia with RVR, resolved Presented with 6 days of shortness of breath and decreased energy.  He noted shortness of breath even with long sentences.  He denied chest pain and palpitations.  He is found to have tachycardia with heart rate up to 180 at urgent care and sent to the ED.  In the ED, vitals were notable for blood pressure up to 144/110 and heart rate up to 183.  Physical exam was notable for shortness of breath while speaking.  Initial labs remarkable for a i-STAT troponin of 0.06.  EKG showed SVT suspicious for atrial flutter and T wave inversions in lateral and inferior leads.  Diltiazem gtt was started in the ED, Cardiology was consulted.  While still in the ED, he spontaneously converted to a NSR.  Cardiology recommended continuing the Dilt drip and trending troponins overnight. -Admit to progressive unit on FPTS1, attending Dr. Madison Hickman -Cardiology following, appreciate recommendations -Continue diltiazem drip -Trend troponins -A.m. EKG -Follow-up echo -f/u risk start labs: TSH, lipid, A1c -monitor vitals  Abdominal pain Hiccups He presented with 1 week of early satiety, hiccups and abdominal pain.  Abdominal pain was mild pain in his upper quadrants bilaterally in addition to mild epigastric pain with palpation, he also noted mild lower back pain bilaterally.  He had 2 episodes of NB/NB emesis on 4/11 and 4/12. He reports loose bowel movements  about once daily.  On exam, he noted mild epigastric and bilateral upper upper quadrant tenderness to palpation.  Admission labs were notable for amylase 71, lipase 37, AST 153, ALT 56, T bili 2.5.  CT abdomen was remarkable for extensive edema surrounding the pancreas consistent with acute pancreatitis or possibly penetrating gastric ulcer, 3.5 cm left adrenal mass was also noted.  The differential for his abdominal pain includes acute pancreatitis,.  Disease, esophagitis, cholecystitis, cardiac etiology.  CT findings heavily support pancreatitis/gastritis although lipase remains within normal limits.  Cholestatic labs support cholecystitis although his physical exam was relatively mild and CT abdomen shows no gallbladder wall thickening or biliary dilatation. -N.p.o. -Normal saline at maintenance rate -Pantoprazole 40 mg twice daily -Progress diet slowly -Tylenol for pain consider increasing pain regimen (very mild pain on admission) -Consider GI consult for gastric ulcer evaluation  Acute kidney injury Difficult to assess baseline creatinine.  Last measurement from 2014 was 1.0.  Creatinine on admission is 4.2, BUN 79.  He reports a significantly decreased appetite for roughly the past week.  There may be an element of dehydration contributing to an elevated creatinine.  BUN/CR ratio slightly under 20:1.  He reports history of hypertension which is poorly medicated.  There may be an element of CKD secondary to poorly controlled hypertension.  He reports multiple family members with diabetes and 1 brother with ESRD on dialysis.  2 L normal saline bolus in the ED.  Maintenance fluids set at 93 mL's per hour. -Continue normal saline at maintenance rate -Follow-up a.m. BMP -Follow-up A1c  Hypertension, chronic Reports a history of hypertension for which he took HCTZ.  He  reports not having taken HCTZ in for at least the past 6 months.  Reports that he typically measures his blood pressure at home or at  the pharmacy.  Is not sure what his typical blood pressures have been.  Since admission, blood pressures have been generally in the normal range. -Continue to monitor  Alcohol use disorder He reports drinking three 12 ounce beers and 2 glasses of wine daily.  He reports his last drink was on 4/10.  He denies ever having withdrawal symptoms in the past. -EtOH level pending -CIWA score Q 6 hours -No Ativan ordered presently  Hypercalcemia Calcium of 10.8 on admission.  This may be related to dehydration, sarcoidosis, CKD. 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  Is not currently taking any medication for this.  FEN/GI: N.p.o., normal saline at maintenance rate Prophylaxis: Lovenox  Disposition: Expected 2-3 days of hospitalization   History of Present Illness:  Joel Baker is a 58 y.o. male presenting with SVT and pancreatitis.  His past medical history is significant for EtOH use d/o, hypertension, sarcoidosis.  He reports 1 week of shortness of breath and hiccups.  He reports that the symptoms started on 4/10.  He noted that he would feel short of breath while speaking had trouble completing sentences.  He also noted that he had a significantly decreased appetite had difficulty finishing his meals.  He reported feeling full very quickly.  On 4/11 and 4/12 he had episodes of NB/NB emesis.  He has not experienced any more vomiting since that time.  He continues to note decreased appetite and early satiety.  He felt worsening during the week and decided to be seen at urgent care today.  At urgent care, he was found to have supraventricular tachycardia and was encouraged to be seen in the emergency department.  During the preceding week and today, he denied chest pain and palpitations.  On review of systems he noted that he was experiencing some fever chills  although he had no documented fevers at home.  He also noted burning pain preceding urination in addition to bilateral lower back pain.  In the ED, he was found to be tachycardic up to 180 bpm with an EKG suspicious for atrial flutter/atrial fibrillation.  Diltiazem drip was started and ultimately he resumed a regular rate and rhythm.  Cardiology was consulted and recommended continuing the diltiazem drip till he can be seen in the morning.  CT abdomen was notable for significant edema surrounding his pancreas consistent with pancreatitis or possible gastric ulcer.  Review Of Systems: Per HPI with the following additions:   Review of Systems  Constitutional: Positive for chills, malaise/fatigue and weight loss. Negative for diaphoresis and fever (subjective fever chills, no documented fevers).  HENT: Negative for congestion, hearing loss and sore throat.   Eyes: Positive for blurred vision.  Respiratory: Negative for cough.   Cardiovascular: Negative for chest pain, palpitations, orthopnea and leg swelling.  Gastrointestinal: Negative for blood in stool and melena. Diarrhea: loose bowel movements once per day.  Genitourinary: Positive for dysuria. Negative for frequency, hematuria and urgency.  Musculoskeletal: Negative for joint pain and myalgias.  Neurological: Negative for tremors and headaches.  Endo/Heme/Allergies: Does not bruise/bleed easily.    Patient Active Problem List   Diagnosis Date Noted  . Narrow complex tachycardia ([place]) 02/26/2019  . Acute pancreatitis  02/26/2019  . Essential hypertension 02/26/2019  . SVT (supraventricular tachycardia) ([place]) 02/26/2019    Past Medical History: Past Medical History:  Diagnosis Date  . Hypertension     Past Surgical History: History reviewed. No pertinent surgical history.  Social History: Social History   Tobacco Use  . Smoking status: Current Every Day Smoker  . Smokeless tobacco: Never Used  Substance Use Topics  .  Alcohol use: Yes    Comment: daily  . Drug use: Yes    Types: Marijuana    Comment: every other day   Additional social history: lives with wife Please also refer to relevant sections of EMR.  Family History: History reviewed. No pertinent family history. Three siblings with diabetes One brother with DM and ESRD   Allergies and Medications: No Known Allergies No current facility-administered medications on file prior to encounter.    Current Outpatient Medications on File Prior to Encounter  Medication Sig Dispense Refill  . hydrochlorothiazide (HYDRODIURIL) 25 MG tablet Take 1 tablet (25 mg total) by mouth daily. 30 tablet 0    Objective: BP (!) 124/95   Pulse 99   Temp 97.8 F (36.6 C) (Oral)   Resp 16   Ht 5\' 9"  (1.753 m)   Wt 54.5 kg   SpO2 99%   BMI 17.74 kg/m  Exam: Physical Exam Constitutional:      General: He is not in acute distress.    Appearance: He is not toxic-appearing.     Comments: Low muscle mass  HENT:     Head: Normocephalic and atraumatic.     Comments: Dry oral mucosa Eyes:     General: No scleral icterus.       Right eye: No discharge.        Left eye: No discharge.     Conjunctiva/sclera: Conjunctivae normal.     Comments: Exotropia of the right eye, normal tracking of the left eye.  Reported blurred vision of the left eye.  Neck:     Musculoskeletal: Normal range of motion and neck supple. No neck rigidity or muscular tenderness.  Cardiovascular:     Rate and Rhythm: Regular rhythm.     Pulses: Normal pulses.     Heart sounds: No murmur. No friction rub. No gallop.      Comments: Mildly tachycardic on exam with a heart rate between 90 and 108. Pulmonary:     Effort: Pulmonary effort is normal. No respiratory distress.     Breath sounds: Normal breath sounds. No wheezing or rales.  Abdominal:     General: Abdomen is flat. Bowel sounds are normal.     Palpations: Abdomen is soft.     Tenderness: There is abdominal tenderness. There  is no right CVA tenderness, left CVA tenderness or guarding.     Comments: Mild tenderness to palpation  In the epigastric area and upper quadrants bilaterally.  Musculoskeletal:        General: No swelling or tenderness.     Right lower leg: No edema.     Left lower leg: No edema.  Lymphadenopathy:     Cervical: No cervical adenopathy.  Skin:    General: Skin is warm and dry.     Findings: No erythema or rash.  Neurological:     General: No focal deficit present.     Mental Status: He is alert and oriented to person, place, and time.  Psychiatric:        Mood and Affect: Mood normal.  Behavior: Behavior normal.     Labs and Imaging: CBC BMET  Recent Labs  Lab 02/26/19 1509  WBC 17.9*  HGB 16.6  HCT 46.9  PLT 283   Recent Labs  Lab 02/26/19 1509  NA 131*  K 4.0  CL 91*  CO2 24  BUN 79*  CREATININE 4.20*  GLUCOSE 184*  CALCIUM 10.8*     Ct Abdomen Pelvis Wo Contrast  Result Date: 02/26/2019 CLINICAL DATA:  58 y/o  M; nausea, vomiting, abdominal pain. EXAM: CT ABDOMEN AND PELVIS WITHOUT CONTRAST TECHNIQUE: Multidetector CT imaging of the abdomen and pelvis was performed following the standard protocol without IV contrast. COMPARISON:  None. FINDINGS: Lower chest: No acute abnormality. Hepatobiliary: No focal liver abnormality is seen. No gallstones, gallbladder wall thickening, or biliary dilatation. Pancreas: Extensive edema surrounding the pancreas, throughout the retroperitoneum and into the mesentery no main duct dilatation or hypoattenuation of the pancreas. There is a fluid collection which extends along greater curvature of the stomach and the body of the pancreas spanning up to 8 cm (series 3, image 16 and series 6, image 45). Spleen: Normal in size without focal abnormality. Adrenals/Urinary Tract: Left adrenal mass measuring 3.5 cm and 18 HU (series 3, image 22). Normal right adrenal gland. No focal kidney lesion identified. No urinary stone disease or  hydronephrosis. Normal bladder. Stomach/Bowel: Stomach appears unremarkable on this noncontrast examination. Appendix appears normal. No evidence of bowel wall thickening, distention, or inflammatory changes. Vascular/Lymphatic: Aortic atherosclerosis. No enlarged abdominal or pelvic lymph nodes. Reproductive: Prostate is unremarkable. Other: No abdominal wall hernia. Musculoskeletal: No fracture is seen. IMPRESSION: 1. Extensive edema surrounding the pancreas, left posterior retroperitoneum, and mesentery. Fluid collection along greater curvature of stomach and body of pancreas. Findings favor acute pancreatitis with acute peripancreatic collection, less likely penetrating gastric ulcer. 2. 3.5 cm indeterminate left adrenal mass. Further characterization with adrenal protocol CT or MRI is recommended on a nonemergent basis. This recommendation follows ACR consensus guidelines: Management of Incidental Adrenal Masses: A White Paper of the ACR Incidental Findings Committee. J Am Coll Radiol 2017;14:1038-1044. Electronically Signed   By: Kristine Garbe M.D.   On: 02/26/2019 20:31   Dg Chest Portable 1 View  Result Date: 02/26/2019 CLINICAL DATA:  Shortness of breath.  Elevated heart rate. EXAM: PORTABLE CHEST 1 VIEW COMPARISON:  02/26/2019 acute abdomen series FINDINGS: Hyperinflation. Midline trachea. Normal heart size and mediastinal contours. No pleural effusion or pneumothorax. Possible 8 mm nodular density projecting over the right lung base. Numerous leads and wires project over the chest. IMPRESSION: Hyperinflation, without acute disease. Possible nodular density projecting over the right lung base. Consider PA and lateral radiographs after removal of all leads and wires. Electronically Signed   By: Abigail Miyamoto M.D.   On: 02/26/2019 18:14   Dg Abd Acute W/chest  Result Date: 02/26/2019 CLINICAL DATA:  Shortness of breath, abdominal pain, diarrhea. EXAM: DG ABDOMEN ACUTE W/ 1V CHEST  COMPARISON:  None. FINDINGS: There is no evidence of dilated bowel loops or free intraperitoneal air. No radiopaque calculi or other significant radiographic abnormality is seen. Heart size and mediastinal contours are within normal limits. Both lungs are clear. IMPRESSION: No evidence of bowel obstruction or ileus. No acute cardiopulmonary disease. Electronically Signed   By: Marijo Conception M.D.   On: 02/26/2019 15:41     Matilde Haymaker, MD 02/26/2019, 9:37 PM  PGY-1, [place] Intern pager: [phone], text pages welcome   RESIDENT ATTESTATION  I have seen and examined this patient.    I have discussed the findings and exam with the intern and agree with the above note, which I have edited appropriately in [place]. I helped develop the management plan that is described in the resident's note, and I agree with the content.   Marny Lowenstein, MD, MS FAMILY MEDICINE RESIDENT - PGY2 02/26/2019 11:41 PM

## 2019-02-26 NOTE — ED Notes (Signed)
Pt fell asleep and approximately at 1920 converted to NSR at a rate of 105.  EKG repeated and CT updated that he can travel to CT now.

## 2019-02-26 NOTE — ED Notes (Signed)
Spoke with pharmacy regarding patients cardizem gtt.

## 2019-02-26 NOTE — ED Notes (Signed)
Patient transported to CT 

## 2019-02-26 NOTE — ED Notes (Signed)
Initial contact with patient. Patient placed on cardiac monitor and zoll monitor. HR 180's. Patient denies CP at this time. Reports SOB.

## 2019-02-26 NOTE — ED Triage Notes (Signed)
Patient presents to Urgent Care with complaints of hiccups, abdominal pain, loss of appetite for anything but fruit, one episode of diarrhea, and now slight shortness of breath since about a month ago. Patient states he took Rolaids at home.

## 2019-02-27 ENCOUNTER — Inpatient Hospital Stay (HOSPITAL_COMMUNITY): Payer: Self-pay

## 2019-02-27 DIAGNOSIS — K251 Acute gastric ulcer with perforation: Secondary | ICD-10-CM

## 2019-02-27 DIAGNOSIS — I483 Typical atrial flutter: Secondary | ICD-10-CM

## 2019-02-27 DIAGNOSIS — K259 Gastric ulcer, unspecified as acute or chronic, without hemorrhage or perforation: Secondary | ICD-10-CM

## 2019-02-27 DIAGNOSIS — I248 Other forms of acute ischemic heart disease: Secondary | ICD-10-CM

## 2019-02-27 DIAGNOSIS — I4892 Unspecified atrial flutter: Secondary | ICD-10-CM

## 2019-02-27 DIAGNOSIS — N179 Acute kidney failure, unspecified: Secondary | ICD-10-CM

## 2019-02-27 LAB — RAPID URINE DRUG SCREEN, HOSP PERFORMED
Amphetamines: NOT DETECTED
Barbiturates: NOT DETECTED
Benzodiazepines: NOT DETECTED
Cocaine: NOT DETECTED
Opiates: NOT DETECTED
Tetrahydrocannabinol: POSITIVE — AB

## 2019-02-27 LAB — LIPID PANEL
Cholesterol: 102 mg/dL (ref 0–200)
HDL: 22 mg/dL — ABNORMAL LOW (ref >40)
LDL Cholesterol: 47 mg/dL (ref 0–99)
Total CHOL/HDL Ratio: 4.6 RATIO
Triglycerides: 163 mg/dL — ABNORMAL HIGH (ref <150)
VLDL: 33 mg/dL (ref 0–40)

## 2019-02-27 LAB — COMPREHENSIVE METABOLIC PANEL
ALT: 39 U/L (ref 0–44)
AST: 63 U/L — ABNORMAL HIGH (ref 15–41)
Albumin: 2.5 g/dL — ABNORMAL LOW (ref 3.5–5.0)
Alkaline Phosphatase: 82 U/L (ref 38–126)
Anion gap: 13 (ref 5–15)
BUN: 62 mg/dL — ABNORMAL HIGH (ref 6–20)
CO2: 18 mmol/L — ABNORMAL LOW (ref 22–32)
Calcium: 9.6 mg/dL (ref 8.9–10.3)
Chloride: 103 mmol/L (ref 98–111)
Creatinine, Ser: 2.31 mg/dL — ABNORMAL HIGH (ref 0.61–1.24)
GFR calc Af Amer: 35 mL/min — ABNORMAL LOW (ref >60)
GFR calc non Af Amer: 30 mL/min — ABNORMAL LOW (ref >60)
Glucose, Bld: 122 mg/dL — ABNORMAL HIGH (ref 70–99)
Potassium: 4.3 mmol/L (ref 3.5–5.1)
Sodium: 134 mmol/L — ABNORMAL LOW (ref 135–145)
Total Bilirubin: 1.5 mg/dL — ABNORMAL HIGH (ref 0.3–1.2)
Total Protein: 6.7 g/dL (ref 6.5–8.1)

## 2019-02-27 LAB — HEMOGLOBIN A1C
Hgb A1c MFr Bld: 5.4 % (ref 4.8–5.6)
Mean Plasma Glucose: 108.28 mg/dL

## 2019-02-27 LAB — CBC
HCT: 36.8 % — ABNORMAL LOW (ref 39.0–52.0)
Hemoglobin: 13.2 g/dL (ref 13.0–17.0)
MCH: 35.4 pg — ABNORMAL HIGH (ref 26.0–34.0)
MCHC: 35.9 g/dL (ref 30.0–36.0)
MCV: 98.7 fL (ref 80.0–100.0)
Platelets: 250 10*3/uL (ref 150–400)
RBC: 3.73 MIL/uL — ABNORMAL LOW (ref 4.22–5.81)
RDW: 13.2 % (ref 11.5–15.5)
WBC: 15.6 10*3/uL — ABNORMAL HIGH (ref 4.0–10.5)
nRBC: 0 % (ref 0.0–0.2)

## 2019-02-27 LAB — MAGNESIUM: Magnesium: 1.9 mg/dL (ref 1.7–2.4)

## 2019-02-27 LAB — HIV ANTIBODY (ROUTINE TESTING W REFLEX): HIV Screen 4th Generation wRfx: NONREACTIVE

## 2019-02-27 LAB — TROPONIN I
Troponin I: 0.04 ng/mL (ref <0.03)
Troponin I: 0.03 ng/mL (ref <0.03)

## 2019-02-27 LAB — TSH: TSH: 0.505 u[IU]/mL (ref 0.350–4.500)

## 2019-02-27 LAB — SODIUM, URINE, RANDOM: Sodium, Ur: 18 mmol/L

## 2019-02-27 LAB — ETHANOL: Alcohol, Ethyl (B): <10 mg/dL (ref <10)

## 2019-02-27 MED ORDER — DILTIAZEM HCL ER COATED BEADS 180 MG PO CP24
180.0000 mg | ORAL_CAPSULE | Freq: Every day | ORAL | Status: DC
  Administered 2019-02-27 – 2019-02-28 (×2): 180 mg via ORAL
  Filled 2019-02-27 (×2): qty 1

## 2019-02-27 MED ORDER — ENSURE ENLIVE PO LIQD
237.0000 mL | Freq: Three times a day (TID) | ORAL | Status: DC
  Administered 2019-02-27 – 2019-03-05 (×11): 237 mL via ORAL

## 2019-02-27 NOTE — Progress Notes (Signed)
error 

## 2019-02-27 NOTE — Discharge Summary (Addendum)
[facility] Discharge Summary  Patient name: Joel Baker Medical record number: [id number] Date of birth: May 17, 1961 Age: 58 y.o. Gender: male Date of Admission: 02/26/2019  Date of Discharge: 03/06/2019 Admitting Physician: Matilde Haymaker, MD  Primary Care Provider: Scifres, Earlie Server, PA-C Consultants: Cardiology, GI   Indication for Hospitalization: Atrial Flutter and Acute Pancreatitis   Discharge Diagnoses/Problem List:  SVT Pancreatitis AKI HTN Alcohol use Sarcoidosis  Leukocytosis/Fever  Disposition: home  Discharge Condition: stable  Discharge Exam:  General: 58 y.o. male in NAD Cardio: RRR no m/r/g Lungs: CTAB, no wheezing, no rhonchi, no crackles Abdomen: Soft, non-tender to palpation, positive bowel sounds Skin: warm and dry Extremities: No edema  Brief Hospital Course:  Joel Baker is a 58 y.o. male presenting to ED in SVT and abdominal pain thought to be 2/2 pancreatitis. PMHx significant for ETOH use, HTN, and sarcoidosis.   Atrial Flutter Patient presenting to ED in SVT with HR up to 180. Initially seen in Urgent care and transferred to [facility] ED. EKG showing SVT suspicious for atrial flutter and twave inversions in lateral and inferior leads. Cardiology was consulted and recommended diltiazem gtt. Patient spontaneously converted to NSR in ED. Troponin trended at 0.06>0.04>0.05>0.04>0.03, 2/2 demand ischemia in setting of a flutter with RVR. Per cardiology patient with a flutter with 2:1 block. After 24 hrs in NSR, patient transitioned to PO cardizem. Po cardizem required titration while patient remained inpatient for persistent tachycardia.  Discharged with Diltiazem 300mg  QD.   Alcoholic Pancreatitis  On admission patient complaining of abdominal pain. PE benign. Found to have admission labs notable for amylase 71, lipase 37, AST 153, ALT 56, T bili 2.5. CT abdomen was remarkable for extensive edema surrounding the pancreas consistent with  acute pancreatitis or possibly penetrating gastric ulcer,3.5 cm left adrenal mass was also noted. The differential for his abdominal pain includes acute pancreatitis. GI consulted and recommended upper GI series which showed lack of gastric or duodenal ulcer, but there was evidence of partial SBO. Abd XR was negative for bowel perforation or obstruction.  CT Abd/Pelvis consistent with acute pancreatitis without necrosis.  Abdominal pain felt to be 2/2 alcohol induced acute pancreatitis. Abdominal pain continued to improve.  Lipase remained WNL.  Fever and Leukocytosis On 4/18, patient was noted to be febrile to 101.  Patient was kept for work up of possible fever.  CXR on that day was negative, although read did suggest possible bilateral pleural effusions, urine cx was negative.  On 4/19, WBC began to trend up from 15.4 to 20.8 and patient was again febrile to 100.8.  On 4/20, patient had been afebrile over 24 hrs, although WBC continued to increase to 22.1.  Sources of fever continued to be worked up. Patient noted some dyspnea to his wife, therefore repeat CXR performed which showed small left effusion with left base atelectasis. On 4/21 patient febrile to 100.4 and patient also febrile on 4/22 to 100.5.  ID was consulted and recommended that given continued fevers, would repeat blood cultures, obtain lipase (which was WNL and improved from previous), and tagged WBC scan. Blood cultures were NG2D and NG4D, and tagged WBC scan showed possible low-level diffuse pulmonary activity suspicious for possible pneumoniitis, recommended follow-up chest radiographs.  Patient had CXRs throughout hospitalization, has history of sarcoidosis, and remained without respiratory complaint, therefore not repeated.  Leukocytosis was likely 2/2 refeeding in the setting of pancreatitis.  At the time of discharge patient's WBC was 16.2 and he had been afebrile  x 48 hrs.  Issues for Follow Up:  1. Ensure Endocrinology f/u for  adrenal mass. 2. Needs Echo as outpatient as he is currently on diltiazem and was deferred by cards while inpatient as would not change current management.  Should have within 6 months of discharge. 3. Ensure follow up with Dr. Britt Bolognese, Electrophysiologist. 4. Ensure f/u with cards as scheduled 5. Check WBC in 1 week from discharge to ensure that it is improved.  WBC 16.2 on discharge. 6. 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  Eliquis is new prescription and was sent to [facility] on d/c which he did not need to pay for, but he may need an orange card at follow up appointment.  If cost remains an issue, would need to transition to warfarin. 7. Dr. Sandi Carne has agreed to be this patient's PCP should he follow up at [facility].  Appointment was scheduled.  Significant Procedures: None  Significant Labs and Imaging:  Recent Labs  Lab 03/04/19 0331 03/05/19 0321 03/06/19 0336  WBC 23.2* 19.1* 16.2*  HGB 11.7* 11.3* 11.0*  HCT 33.1* 32.3* 31.9*  PLT 421* 442* 491*   Recent Labs  Lab 02/28/19 0245  03/02/19 0427 03/03/19 0331 03/04/19 0331 03/05/19 0321 03/06/19 0336  NA 136   < > 132* 131* 133* 133* 134*  K 3.1*   < > 4.0 3.9 3.7 3.8 4.1  CL 101   < > 99 98 99 100 102  CO2 25   < > 25 25 26 24 25   GLUCOSE 96   < > 101* 101* 98 102* 98  BUN 16   < > 5* 7 7 5* <5*  CREATININE 1.02   < > 0.91 0.89 0.86 0.77 0.75  CALCIUM 8.9   < > 9.0 9.6 9.7 9.7 10.0  ALKPHOS 80  --   --   --  112  --   --   AST 75*  --   --   --  78*  --   --   ALT 37  --   --   --  55*  --   --   ALBUMIN 2.1*  --   --   --  2.1*  --   --    < > = values in this interval not displayed.    Ct Abdomen Pelvis Wo Contrast  Result Date: 02/26/2019 CLINICAL DATA:  58 y/o  M; nausea, vomiting, abdominal pain. EXAM: CT ABDOMEN AND PELVIS WITHOUT CONTRAST TECHNIQUE: Multidetector CT imaging of the abdomen and pelvis was performed following the standard protocol without IV  contrast. COMPARISON:  None. FINDINGS: Lower chest: No acute abnormality. Hepatobiliary: No focal liver abnormality is seen. No gallstones, gallbladder wall thickening, or biliary dilatation. Pancreas: Extensive edema surrounding the pancreas, throughout the retroperitoneum and into the mesentery no main duct dilatation or hypoattenuation of the pancreas. There is a fluid collection which extends along greater curvature of the stomach and the body of the pancreas spanning up to 8 cm (series 3, image 16 and series 6, image 45). Spleen: Normal in size without focal abnormality. Adrenals/Urinary Tract: Left adrenal mass measuring 3.5 cm and 18 HU (series 3, image 22). Normal right adrenal gland. No focal kidney lesion identified. No urinary stone disease or hydronephrosis. Normal bladder. Stomach/Bowel: Stomach appears unremarkable on this noncontrast examination. Appendix appears normal. No evidence of bowel wall thickening, distention, or inflammatory changes. Vascular/Lymphatic: Aortic atherosclerosis. No enlarged abdominal or pelvic lymph nodes.  Reproductive: Prostate is unremarkable. Other: No abdominal wall hernia. Musculoskeletal: No fracture is seen. IMPRESSION: 1. Extensive edema surrounding the pancreas, left posterior retroperitoneum, and mesentery. Fluid collection along greater curvature of stomach and body of pancreas. Findings favor acute pancreatitis with acute peripancreatic collection, less likely penetrating gastric ulcer. 2. 3.5 cm indeterminate left adrenal mass. Further characterization with adrenal protocol CT or MRI is recommended on a nonemergent basis. This recommendation follows ACR consensus guidelines: Management of Incidental Adrenal Masses: A White Paper of the ACR Incidental Findings Committee. J Am Coll Radiol 2017;14:1038-1044. Electronically Signed   By: Kristine Garbe M.D.   On: 02/26/2019 20:31   Dg Chest Portable 1 View  Result Date: 02/26/2019 CLINICAL DATA:   Shortness of breath.  Elevated heart rate. EXAM: PORTABLE CHEST 1 VIEW COMPARISON:  02/26/2019 acute abdomen series FINDINGS: Hyperinflation. Midline trachea. Normal heart size and mediastinal contours. No pleural effusion or pneumothorax. Possible 8 mm nodular density projecting over the right lung base. Numerous leads and wires project over the chest. IMPRESSION: Hyperinflation, without acute disease. Possible nodular density projecting over the right lung base. Consider PA and lateral radiographs after removal of all leads and wires. Electronically Signed   By: Abigail Miyamoto M.D.   On: 02/26/2019 18:14   Dg Abd Acute W/chest  Result Date: 02/26/2019 CLINICAL DATA:  Shortness of breath, abdominal pain, diarrhea. EXAM: DG ABDOMEN ACUTE W/ 1V CHEST COMPARISON:  None. FINDINGS: There is no evidence of dilated bowel loops or free intraperitoneal air. No radiopaque calculi or other significant radiographic abnormality is seen. Heart size and mediastinal contours are within normal limits. Both lungs are clear. IMPRESSION: No evidence of bowel obstruction or ileus. No acute cardiopulmonary disease. Electronically Signed   By: Marijo Conception M.D.   On: 02/26/2019 15:41   Dg Duanne Limerick W Double Cm (hd Ba)  Result Date: 02/27/2019 CLINICAL DATA:  Abdominal pain. Rule out pancreatitis or gastric ulcer on CT. EXAM: UPPER GI SERIES WITH KUB TECHNIQUE: After obtaining a scout radiograph a routine upper GI series was performed using thin and high density barium. FLUOROSCOPY TIME:  Fluoroscopy Time:  1 minutes 48 seconds Radiation Exposure Index (if provided by the fluoroscopic device): Number of Acquired Spot Images: 0 COMPARISON:  CT abdomen pelvis 02/26/2019 FINDINGS: Preliminary KUB demonstrates mildly distended colon and small bowel. Esophageal mucosa and motility normal. No esophageal stricture or mass. Negative for hiatal hernia or reflux Negative for gastric ulcer or mass.  No significant mucosal edema. Barium emptied  readily into the small bowel. Negative for duodenal ulcer. Mild dilatation of the proximal small bowel including the duodenum and jejunum. IMPRESSION: 1. Negative for gastric ulcer.  Negative for duodenal ulcer 2. Normal esophagus 3. Mild dilatation of the small bowel raising the possibility of partial small bowel obstruction. Review of prior studies does not reveal definite dilatation of the jejunum on recent CT. Recommend two-view abdomen in AM for follow-up. Electronically Signed   By: Franchot Gallo M.D.   On: 02/27/2019 15:57   Results/Tests Pending at Time of Discharge:  Unresulted Labs (From admission, onward)   None       Discharge Medications:  Allergies as of 03/06/2019   No Known Allergies     Medication List    TAKE these medications   apixaban 5 MG Tabs tablet Commonly known as:  ELIQUIS Take 1 tablet (5 mg total) by mouth 2 (two) times daily.   diltiazem 300 MG 24 hr capsule Commonly known as:  CARDIZEM CD Take 1 capsule (300 mg total) by mouth daily. Start taking on:  March 07, 2019   pantoprazole 40 MG tablet Commonly known as:  PROTONIX Take 1 tablet (40 mg total) by mouth daily. Start taking on:  March 07, 2019   thiamine 100 MG tablet Take 1 tablet (100 mg total) by mouth daily. Start taking on:  March 07, 2019       Discharge Instructions: Please refer to Patient Instructions section of EMR for full details.  Patient was counseled important signs and symptoms that should prompt return to medical care, changes in medications, dietary instructions, activity restrictions, and follow up appointments.   Follow-Up Appointments: Follow-up Information    Hilty, Nadean Corwin, MD Follow up.   Specialty:  Cardiology Why:  You have a follow-up visit sheduled for 05/06/2019 at 9:15am. Due to the coronavirus, there is a chance that this could get change to a virtual (phone or video) visit but our office will notify you if this happens. Contact information: [address] [place] [address] [phone]        [place]. Go on 03/13/2019.   Specialty:  Family Medicine Why:  at 1:30 pm Contact information: [address] [id number] [place]          Cleophas Dunker, DO 03/06/2019, 12:14 PM PGY-1, [place]

## 2019-02-27 NOTE — Progress Notes (Signed)
Progress Note  Patient Name: Joel Baker Date of Encounter: 02/27/2019  Primary Cardiologist: Dr Debara Pickett  Subjective   SOB has resolved, no palpitations.   Inpatient Medications    Scheduled Meds:  enoxaparin (LOVENOX) injection  30 mg Subcutaneous N23F   folic acid  1 mg Oral Daily   multivitamin with minerals  1 tablet Oral Daily   pantoprazole (PROTONIX) IV  40 mg Intravenous Q12H   thiamine  100 mg Oral Daily   Or   thiamine  100 mg Intravenous Daily   Continuous Infusions:  sodium chloride 94 mL/hr at 02/27/19 0852   diltiazem (CARDIZEM) infusion 15 mg/hr (02/27/19 0526)   PRN Meds: acetaminophen **OR** acetaminophen, ondansetron **OR** ondansetron (ZOFRAN) IV   Vital Signs    Vitals:   02/27/19 0055 02/27/19 0437 02/27/19 0438 02/27/19 0854  BP: (!) 106/92   127/81  Pulse: 91   100  Resp: (!) 25   18  Temp: 99.2 F (37.3 C) 98.8 F (37.1 C)  99.7 F (37.6 C)  TempSrc: Oral Oral  Oral  SpO2: 97%   95%  Weight: 58.1 kg  58.2 kg   Height: 5\' 9"  (1.753 m)       Intake/Output Summary (Last 24 hours) at 02/27/2019 1120 Last data filed at 02/27/2019 0630 Gross per 24 hour  Intake 2989.25 ml  Output 300 ml  Net 2689.25 ml   Last 3 Weights 02/27/2019 02/27/2019 02/26/2019  Weight (lbs) 128 lb 4.9 oz 128 lb 1.4 oz 120 lb 2.4 oz  Weight (kg) 58.2 kg 58.1 kg 54.5 kg      Telemetry    SR - Personally Reviewed  ECG    SR, negative T waves in the inferolateral leads - Personally Reviewed  Physical Exam   GEN: No acute distress.   Neck: No JVD Cardiac: RRR, no murmurs, rubs, or gallops.  Respiratory: Clear to auscultation bilaterally. GI: Soft, nontender, non-distended  MS: No edema; No deformity. Neuro:  Nonfocal  Psych: Normal affect   Labs    Chemistry Recent Labs  Lab 02/26/19 1509 02/27/19 0319  NA 131* 134*  K 4.0 4.3  CL 91* 103  CO2 24 18*  GLUCOSE 184* 122*  BUN 79* 62*  CREATININE 4.20* 2.31*  CALCIUM 10.8* 9.6  PROT 8.4*  6.7  ALBUMIN 3.1* 2.5*  AST 153* 63*  ALT 56* 39  ALKPHOS 103 82  BILITOT 2.5* 1.5*  GFRNONAA 15* 30*  GFRAA 17* 35*  ANIONGAP 16* 13     Hematology Recent Labs  Lab 02/26/19 1509 02/27/19 0319  WBC 17.9* 15.6*  RBC 4.80 3.73*  HGB 16.6 13.2  HCT 46.9 36.8*  MCV 97.7 98.7  MCH 34.6* 35.4*  MCHC 35.4 35.9  RDW 13.2 13.2  PLT 283 250    Cardiac Enzymes Recent Labs  Lab 02/26/19 2019 02/26/19 2126 02/27/19 0319 02/27/19 0936  TROPONINI 0.04* 0.05* 0.04* 0.03*   No results for input(s): TROPIPOC in the last 168 hours.   BNPNo results for input(s): BNP, PROBNP in the last 168 hours.   DDimer No results for input(s): DDIMER in the last 168 hours.   Radiology    Ct Abdomen Pelvis Wo Contrast  Result Date: 02/26/2019 CLINICAL DATA:  58 y/o  M; nausea, vomiting, abdominal pain. EXAM: CT ABDOMEN AND PELVIS WITHOUT CONTRAST TECHNIQUE: Multidetector CT imaging of the abdomen and pelvis was performed following the standard protocol without IV contrast. COMPARISON:  None. FINDINGS: Lower chest: No acute abnormality. Hepatobiliary:  No focal liver abnormality is seen. No gallstones, gallbladder wall thickening, or biliary dilatation. Pancreas: Extensive edema surrounding the pancreas, throughout the retroperitoneum and into the mesentery no main duct dilatation or hypoattenuation of the pancreas. There is a fluid collection which extends along greater curvature of the stomach and the body of the pancreas spanning up to 8 cm (series 3, image 16 and series 6, image 45). Spleen: Normal in size without focal abnormality. Adrenals/Urinary Tract: Left adrenal mass measuring 3.5 cm and 18 HU (series 3, image 22). Normal right adrenal gland. No focal kidney lesion identified. No urinary stone disease or hydronephrosis. Normal bladder. Stomach/Bowel: Stomach appears unremarkable on this noncontrast examination. Appendix appears normal. No evidence of bowel wall thickening, distention, or  inflammatory changes. Vascular/Lymphatic: Aortic atherosclerosis. No enlarged abdominal or pelvic lymph nodes. Reproductive: Prostate is unremarkable. Other: No abdominal wall hernia. Musculoskeletal: No fracture is seen. IMPRESSION: 1. Extensive edema surrounding the pancreas, left posterior retroperitoneum, and mesentery. Fluid collection along greater curvature of stomach and body of pancreas. Findings favor acute pancreatitis with acute peripancreatic collection, less likely penetrating gastric ulcer. 2. 3.5 cm indeterminate left adrenal mass. Further characterization with adrenal protocol CT or MRI is recommended on a nonemergent basis. This recommendation follows ACR consensus guidelines: Management of Incidental Adrenal Masses: A White Paper of the ACR Incidental Findings Committee. J Am Coll Radiol 2017;14:1038-1044. Electronically Signed   By: Kristine Garbe M.D.   On: 02/26/2019 20:31   Dg Chest Portable 1 View  Result Date: 02/26/2019 CLINICAL DATA:  Shortness of breath.  Elevated heart rate. EXAM: PORTABLE CHEST 1 VIEW COMPARISON:  02/26/2019 acute abdomen series FINDINGS: Hyperinflation. Midline trachea. Normal heart size and mediastinal contours. No pleural effusion or pneumothorax. Possible 8 mm nodular density projecting over the right lung base. Numerous leads and wires project over the chest. IMPRESSION: Hyperinflation, without acute disease. Possible nodular density projecting over the right lung base. Consider PA and lateral radiographs after removal of all leads and wires. Electronically Signed   By: Abigail Miyamoto M.D.   On: 02/26/2019 18:14   Dg Abd Acute W/chest  Result Date: 02/26/2019 CLINICAL DATA:  Shortness of breath, abdominal pain, diarrhea. EXAM: DG ABDOMEN ACUTE W/ 1V CHEST COMPARISON:  None. FINDINGS: There is no evidence of dilated bowel loops or free intraperitoneal air. No radiopaque calculi or other significant radiographic abnormality is seen. Heart size and  mediastinal contours are within normal limits. Both lungs are clear. IMPRESSION: No evidence of bowel obstruction or ileus. No acute cardiopulmonary disease. Electronically Signed   By: Marijo Conception M.D.   On: 02/26/2019 15:41    Cardiac Studies     Patient Profile     58 y.o. male with h/o etoh abuse admitted with acute pancreatitis  Assessment & Plan    1. Atrial flutter with 2:1 block - small atria and fast conduction - ventricular rates up to 180 BPM - spontaneously cardioverted with iv cardizem -> switch to po cardizem CD 180 mg daily  2. Borderline troponin, abnormal ECG - most probably sec to demand ischemia in the settings of a-flutter with RVR - we wil arrange for a follow up with ECG, the patient has never had chest pain or SOB prior to this episode, if symptoms at the follow up visit we could arrange for an outpatient ischemia eval  3. Etoh abuse  - per primary team   [facility] will sign off.   Medication Recommendations:  As above Other recommendations (labs,  testing, etc):  No further testing Follow up as an outpatient:  We will arrange in 2 months.   For questions or updates, please contact [place] Please consult [email or web address] for contact info under        Signed, Ena Dawley, MD  02/27/2019, 11:20 AM

## 2019-02-27 NOTE — Progress Notes (Signed)
Admitted from [facility] and oriented,pain free, on Cardizem drip.Patient oriented to the room and staff,attached to cardiac monitoring,V/S checked,CCMD notified.Will continue to monitor.

## 2019-02-27 NOTE — Consult Note (Signed)
Consultation  Referring Provider:  Family medicine service/[facility] Primary Care Physician:  Patient, No Pcp Per Primary Gastroenterologist:  None/unassigned  Reason for Consultation:  Abnormal CT scan and complaints of decreased appetite and early satiety  HPI: Joel Baker is a 58 y.o. male who had initially presented to urgent care yesterday with complaints of abdominal discomfort, poor appetite mild chest discomfort and palpitations and was found to be in SVT and therefore sent to the emergency room. He was started on diltiazem and SVT has resolved.  Cardiology has consulted.  Patient apparently has been feeling ill over the past week .  He says his symptoms started last Friday.  He says he began feeling uncomfortable in his upper abdomen and around into his sides and found it difficult to find a comfortable position to sleep.  Is not having any severe pain at any point.  He also felt nauseated last weekend and vomited twice.  He says both times he vomited dark green material.  Since then he has not had any appetite and did not want anything in his stomach.  He has not had any further vomiting.  He ate a little bit of watermelon on Wednesday, 02/25/2019.Marland Kitchen  He does drink alcohol on a daily basis and says his last drink was on 02/20/2019.  No fever or chills at home, no melena or hematochezia.  Prior to onset of symptoms last week he had been feeling fine with no recent abdominal discomfort, appetite is been fine and weight has been stable.  He does not use any regular aspirin NSAIDs BCs etc.  He says he usually drinks 312 ounce beers every day and 216 ounce glasses of wine.  He says he is disabled and home all day and has been in the habit over the past several years.  He has never had any problems with ulcer disease, and no prior pancreatic issues that he is aware of.Marland Kitchen  He feels better today, denies any abdominal pain.  He says he is just not hungry.   CT scan of the abdomen and pelvis done  yesterday showed a normal-appearing liver, and extensive laboratory changes around the pancreas and throughout the retroperitoneum into the mesentery There is a fluid collection extending along the greater curve of the stomach and the body of the pancreas.  Noncontrasted stomach appeared normal.  This was felt to represent an acute pancreatitis, consider less likely penetrating gastric ulcer He also has a 2.5 cm indeterminate lesion in the left adrenal gland   Past Medical History:  Diagnosis Date  . Hypertension     History reviewed. No pertinent surgical history.  Prior to Admission medications   Not on File    Current Facility-Administered Medications  Medication Dose Route Frequency Provider Last Rate Last Dose  . 0.9 %  sodium chloride infusion   Intravenous Continuous Matilde Haymaker, MD 94 mL/hr at 02/27/19 [phone]    . acetaminophen (TYLENOL) tablet 650 mg  650 mg Oral Q6H PRN Matilde Haymaker, MD       Or  . acetaminophen (TYLENOL) suppository 650 mg  650 mg Rectal Q6H PRN Matilde Haymaker, MD      . diltiazem (CARDIZEM CD) 24 hr capsule 180 mg  180 mg Oral Daily Dorothy Spark, MD      . enoxaparin (LOVENOX) injection 30 mg  30 mg Subcutaneous Q24H Matilde Haymaker, MD   30 mg at 02/27/19 0856  . feeding supplement (ENSURE ENLIVE) (ENSURE ENLIVE) liquid 237 mL  237  mL Oral TID BM Leeanne Rio, MD      . folic acid (FOLVITE) tablet 1 mg  1 mg Oral Daily Matilde Haymaker, MD   1 mg at 02/27/19 0856  . multivitamin with minerals tablet 1 tablet  1 tablet Oral Daily Matilde Haymaker, MD   1 tablet at 02/27/19 0855  . ondansetron (ZOFRAN) tablet 4 mg  4 mg Oral Q6H PRN Matilde Haymaker, MD       Or  . ondansetron [facility]) injection 4 mg  4 mg Intravenous Q6H PRN Matilde Haymaker, MD      . pantoprazole (PROTONIX) injection 40 mg  40 mg Intravenous Q12H Matilde Haymaker, MD   40 mg at 02/27/19 0856  . thiamine (VITAMIN B-1) tablet 100 mg  100 mg Oral Daily Matilde Haymaker, MD   100 mg at 02/27/19 4782   Or  .  thiamine (B-1) injection 100 mg  100 mg Intravenous Daily Matilde Haymaker, MD   100 mg at 02/26/19 2208    Allergies as of 02/26/2019  . (No Known Allergies)    History reviewed. No pertinent family history.  Social History   Socioeconomic History  . Marital status: Married    Spouse name: Not on file  . Number of children: Not on file  . Years of education: Not on file  . Highest education level: Not on file  Occupational History  . Not on file  Social Needs  . Financial resource strain: Not on file  . Food insecurity:    Worry: Not on file    Inability: Not on file  . Transportation needs:    Medical: Not on file    Non-medical: Not on file  Tobacco Use  . Smoking status: Current Every Day Smoker  . Smokeless tobacco: Never Used  Substance and Sexual Activity  . Alcohol use: Yes    Comment: daily  . Drug use: Yes    Types: Marijuana    Comment: every other day  . Sexual activity: Not on file  Lifestyle  . Physical activity:    Days per week: Not on file    Minutes per session: Not on file  . Stress: Not on file  Relationships  . Social connections:    Talks on phone: Not on file    Gets together: Not on file    Attends religious service: Not on file    Active member of club or organization: Not on file    Attends meetings of clubs or organizations: Not on file    Relationship status: Not on file  . Intimate partner violence:    Fear of current or ex partner: Not on file    Emotionally abused: Not on file    Physically abused: Not on file    Forced sexual activity: Not on file  Other Topics Concern  . Not on file  Social History Narrative  . Not on file    Review of Systems: Pertinent positive and negative review of systems were noted in the above HPI section.  All other review of systems was otherwise negative.  Physical Exam: Vital signs in last 24 hours: Temp:  [97.8 F (36.6 C)-99.7 F (37.6 C)] 99.7 F (37.6 C) (04/17 0854) Pulse Rate:   [88-183] 100 (04/17 0854) Resp:  [10-25] 18 (04/17 0854) BP: (100-144)/(65-110) 127/81 (04/17 0854) SpO2:  [92 %-100 %] 95 % (04/17 0854) Weight:  [54.5 kg-58.2 kg] 58.2 kg (04/17 0438) Last BM Date: 02/26/19 General:   Alert,  Well-developed, thin older African-American male well-nourished, pleasant and cooperative in NAD Head:  Normocephalic and atraumatic. Eyes:  Sclera icteric, conjunctiva pink. Ears:  Normal auditory acuity. Nose:  No deformity, discharge,  or lesions. Mouth:  No deformity or lesions.   Neck:  Supple; no masses or thyromegaly. Lungs:  Clear throughout to auscultation.   No wheezes, crackles, or rhonchi. Heart:  Regular rate and rhythm; no murmurs, clicks, rubs,  or gallops. Abdomen:  Soft somewhat protuberant, bowel sounds present some high-pitched definite succussion splash.  He is tender in the epigastrium and hypogastrium mildly palpable mass or hepatosplenomegaly Rectal:  Deferred  Msk:  Symmetrical without gross deformities. . Pulses:  Normal pulses noted. Extremities:  Without clubbing or edema. Neurologic:  Alert and  oriented x4;  grossly normal neurologically. Skin:  Intact without significant lesions or rashes.. Psych:  Alert and cooperative. Normal mood and affect.  Intake/Output from previous day: 04/16 0701 - 04/17 0700 In: 2989.3 [I.V.:989.3; IV Piggyback:2000] Out: 300 [Urine:300] Intake/Output this shift: No intake/output data recorded.  Lab Results: Recent Labs    02/26/19 1509 02/27/19 0319  WBC 17.9* 15.6*  HGB 16.6 13.2  HCT 46.9 36.8*  PLT 283 250   BMET Recent Labs    02/26/19 1509 02/27/19 0319  NA 131* 134*  K 4.0 4.3  CL 91* 103  CO2 24 18*  GLUCOSE 184* 122*  BUN 79* 62*  CREATININE 4.20* 2.31*  CALCIUM 10.8* 9.6   LFT Recent Labs    02/27/19 0319  PROT 6.7  ALBUMIN 2.5*  AST 63*  ALT 39  ALKPHOS 82  BILITOT 1.5*     IMPRESSION:  #81 58 year old African-American male with history of chronic alcohol  use/abuse who presents with SVT and 1 week history of upper abdominal discomfort, lack of appetite, and 2 episodes of nausea and vomiting last weekend.  He had not had any p.o.'s in over 48 hours prior to admission.  CT scan is consistent with acute/subacute pancreatitis with extensive peripancreatic inflammatory changes including a fluid collection along the greater curve of the stomach. Stomach appeared normal.  Question raised regarding possible penetrating gastric ulcer. 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  Thank you will follow with you     EsterwoodPA-C  02/27/2019, 11:54 AM

## 2019-02-27 NOTE — Progress Notes (Signed)
Family Medicine Teaching Service Daily Progress Note Intern Pager: [phone]  Patient name: Joel Baker Medical record number: [id number] Date of birth: Jun 19, 1961 Age: 58 y.o. Gender: male  Primary Care Provider: Patient, No Pcp Per Consultants: Cardiology  Code Status: Full   Pt Overview and Major Events to Date:  Admitted to [facility] on 4/16  Assessment and Plan: Joel Baker is a 58 y.o. male presenting with SVT and pancreatitis. PMH is significant for alcohol use disorder, hypertension, sarcoidosis.  Supraventricular tachycardia with RVR, resolved Patient currently asymptomatic in NSR. No longer tachycardic, HR 89.  In the ED, vitals were notable for blood pressure up to 144/110 and heart rate up to 183.  Troponin 0.04>0.05>0.04. AM EKG NSR. Diltiazem gtt was started in the ED, Cardiology was consulted. Cardiology recommended continuing the Dilt drip and trending troponins overnight.  -Cardiology following, appreciate recommendations: have signed off, outpatient follow up  -Continue diltiazem drip per cardiology  -Echo, can consider holding  -monitor vitals  Abdominal pain Hiccups Denies pain today. He had 2 episodes of NB/NB emesis on 4/11 and 4/12. He reports loose bowel movements about once daily. Admission labs were notable for amylase 71, lipase 37, AST 153, ALT 56, T bili 2.5.  CT abdomen was remarkable for extensive edema surrounding the pancreas consistent with acute pancreatitis or possibly penetrating gastric ulcer, 3.5 cm left adrenal mass was also noted.  The differential for his abdominal pain includes acute pancreatitis,.  Disease, esophagitis, cholecystitis, cardiac etiology.  CT findings heavily support pancreatitis/gastritis although lipase remains within normal limits. GI consulted, appreciate recommendations  -N.p.o. -Normal saline at maintenance rate -Pantoprazole 40 mg twice daily -Progress diet slowly -Tylenol for pain consider increasing pain regimen (very mild pain  on admission) -GI consulted, appreciate recommendations     Acute kidney injury Cr of 2.31, down from 4.20. Difficult to assess baseline creatinine.  Last measurement from 2014 was 1.0.He reports a significantly decreased appetite for roughly the past week. He reports history of hypertension which is poorly medicated.  There may be an element of CKD secondary to poorly controlled hypertension.    -Continue normal saline at maintenance rate -Follow on daily BMP  Hypertension, chronic Normotensive, 123/80. Reports a history of hypertension for which he took HCTZ.  He reports not having taken HCTZ in for at least the past 6 months.    -Continue to monitor  Alcohol use disorder He reports drinking three 12 ounce beers and 2 glasses of wine daily.  He reports his last drink was on 4/10.  He denies ever having withdrawal symptoms in the past. -CIWA score Q 6 hours: 1>0>0 -No Ativan ordered presently  Hypercalcemia Resolved. Ca 9.6. Calcium of 10.8 on admission.  -Monitor calcium  Sarcoidosis Per chart review, he has a history of sarcoid uveitis of the right eye for which he follows at [facility].  Is not currently taking any medication for this. -outpatient f/u  FEN/GI: N.p.o., normal saline at maintenance rate Prophylaxis: Lovenox  Disposition: pending further GI workup   Subjective:  Patient reports he is feeling much better. Reports he never had CP or SOB. Today denies any abdominal pain   Objective: Temp:  [97.8 F (36.6 C)-99.7 F (37.6 C)] 99.7 F (37.6 C) (04/17 0854) Pulse Rate:  [88-183] 97 (04/17 1205) Resp:  [10-25] 20 (04/17 1205) BP: (106-144)/(77-110) 121/84 (04/17 1205) SpO2:  [92 %-100 %] 100 % (04/17 1205) Weight:  [54.5 kg-58.2 kg] 58.2 kg (04/17 0438) Physical Exam: General: awake and alert, laying  in bed, NAD Cardiovascular: RRR, no MRG Respiratory: CTAB, no wheezes, rales, or rhonchi  Abdomen: soft, non tender, non distended, bowel sounds present   Extremities: no edema, non tender  Laboratory: Recent Labs  Lab 02/26/19 1509 02/27/19 0319  WBC 17.9* 15.6*  HGB 16.6 13.2  HCT 46.9 36.8*  PLT 283 250   Recent Labs  Lab 02/26/19 1509 02/27/19 0319  NA 131* 134*  K 4.0 4.3  CL 91* 103  CO2 24 18*  BUN 79* 62*  CREATININE 4.20* 2.31*  CALCIUM 10.8* 9.6  PROT 8.4* 6.7  BILITOT 2.5* 1.5*  ALKPHOS 103 82  ALT 56* 39  AST 153* 63*  GLUCOSE 184* 122*     Imaging/Diagnostic Tests: Ct Abdomen Pelvis Wo Contrast  Result Date: 02/26/2019 CLINICAL DATA:  58 y/o  M; nausea, vomiting, abdominal pain. EXAM: CT ABDOMEN AND PELVIS WITHOUT CONTRAST TECHNIQUE: Multidetector CT imaging of the abdomen and pelvis was performed following the standard protocol without IV contrast. COMPARISON:  None. FINDINGS: Lower chest: No acute abnormality. Hepatobiliary: No focal liver abnormality is seen. No gallstones, gallbladder wall thickening, or biliary dilatation. Pancreas: Extensive edema surrounding the pancreas, throughout the retroperitoneum and into the mesentery no main duct dilatation or hypoattenuation of the pancreas. There is a fluid collection which extends along greater curvature of the stomach and the body of the pancreas spanning up to 8 cm (series 3, image 16 and series 6, image 45). Spleen: Normal in size without focal abnormality. Adrenals/Urinary Tract: Left adrenal mass measuring 3.5 cm and 18 HU (series 3, image 22). Normal right adrenal gland. No focal kidney lesion identified. No urinary stone disease or hydronephrosis. Normal bladder. Stomach/Bowel: Stomach appears unremarkable on this noncontrast examination. Appendix appears normal. No evidence of bowel wall thickening, distention, or inflammatory changes. Vascular/Lymphatic: Aortic atherosclerosis. No enlarged abdominal or pelvic lymph nodes. Reproductive: Prostate is unremarkable. Other: No abdominal wall hernia. Musculoskeletal: No fracture is seen. IMPRESSION: 1. Extensive  edema surrounding the pancreas, left posterior retroperitoneum, and mesentery. Fluid collection along greater curvature of stomach and body of pancreas. Findings favor acute pancreatitis with acute peripancreatic collection, less likely penetrating gastric ulcer. 2. 3.5 cm indeterminate left adrenal mass. Further characterization with adrenal protocol CT or MRI is recommended on a nonemergent basis. This recommendation follows ACR consensus guidelines: Management of Incidental Adrenal Masses: A White Paper of the ACR Incidental Findings Committee. J Am Coll Radiol 2017;14:1038-1044. Electronically Signed   By: Kristine Garbe M.D.   On: 02/26/2019 20:31   Dg Chest Portable 1 View  Result Date: 02/26/2019 CLINICAL DATA:  Shortness of breath.  Elevated heart rate. EXAM: PORTABLE CHEST 1 VIEW COMPARISON:  02/26/2019 acute abdomen series FINDINGS: Hyperinflation. Midline trachea. Normal heart size and mediastinal contours. No pleural effusion or pneumothorax. Possible 8 mm nodular density projecting over the right lung base. Numerous leads and wires project over the chest. IMPRESSION: Hyperinflation, without acute disease. Possible nodular density projecting over the right lung base. Consider PA and lateral radiographs after removal of all leads and wires. Electronically Signed   By: Abigail Miyamoto M.D.   On: 02/26/2019 18:14   Dg Abd Acute W/chest  Result Date: 02/26/2019 CLINICAL DATA:  Shortness of breath, abdominal pain, diarrhea. EXAM: DG ABDOMEN ACUTE W/ 1V CHEST COMPARISON:  None. FINDINGS: There is no evidence of dilated bowel loops or free intraperitoneal air. No radiopaque calculi or other significant radiographic abnormality is seen. Heart size and mediastinal contours are within normal limits. Both lungs are  clear. IMPRESSION: No evidence of bowel obstruction or ileus. No acute cardiopulmonary disease. Electronically Signed   By: Marijo Conception M.D.   On: 02/26/2019 15:41     Caroline More, DO 02/27/2019, 2:41 PM PGY-2, [place] Intern pager: [phone], text pages welcome

## 2019-02-27 NOTE — Progress Notes (Signed)
Echo ordered by C Tegeler   Reviewed history.  EKG with typical atrial flutter now SR Note cardiology consult With current clinical progress and current COVID conditions, would recomm deferring echo at this time as will not change current management.     Please call with questions.  Dorris Carnes, MD, [facility]

## 2019-02-27 NOTE — Progress Notes (Signed)
Initial Nutrition Assessment  DOCUMENTATION CODES:   Not applicable  INTERVENTION:    Add Ensure Enlive po TID, each supplement provides 350 kcal and 20 grams of protein  Continue MVI daily  NUTRITION DIAGNOSIS:   Increased nutrient needs related to acute illness as evidenced by estimated needs  GOAL:   Patient will meet greater than or equal to 90% of their needs  MONITOR:   PO intake, Supplement acceptance, Diet advancement, Skin, Weight trends, Labs, I & O's  REASON FOR ASSESSMENT:   Consult Assessment of nutrition requirement/status  ASSESSMENT:   Patient with PMH significant for alcohol abuse, HTN, and sarcoidosis. Presents this admission with SVT and acute pancreatitis.    Pt endorses having a loss in appetite starting one week PTA due to feeling of lethargy. States during this time period he would eat bites of bananas, peaches, watermelon, and strawberry sherbet. Prior to this he would eat three meals daily that consisted of meat, vegetables, and grains (wife typically prepared all meals). Pt endorses drinking three 12 oz Natural Light beers and 2 glasses of wine daily. During weekends he buys one 12 pack of beer and it lasts him two days. Discussed the importance of protein intake for preservation of lean body mass. Pt describes having a hard time enjoying protein food options. Amendable to Ensure this stay. He is eager to eat this am. Awaiting cardiology input before diet advancement?   Pt reports a UBW of 140-145 lb and a recent wt loss of 20 lb in six months (10 lb within the last two weeks). Records are limited in weight history. Unable to confirm this report. Suspect pt is malnourished given dietary recall and reported weight history.   Unable to complete Nutrition-Focused physical exam at this time.   Drips: NS @ 95 ml/hr Medications: folic acid, MVI with minerals, thiamine Labs: NA 134 (L)    Diet Order:   Diet Order            Diet NPO time specified   Diet effective now              EDUCATION NEEDS:   Education needs have been addressed  Skin:  Skin Assessment: Reviewed RN Assessment  Last BM:  4/16  Height:   Ht Readings from Last 1 Encounters:  02/27/19 5\' 9"  (1.753 m)    Weight:   Wt Readings from Last 1 Encounters:  02/27/19 58.2 kg    Ideal Body Weight:  72.7 kg  BMI:  Body mass index is 18.95 kg/m.  Estimated Nutritional Needs:   Kcal:  1800-2000 kcal  Protein:  90-115 grams  Fluid:  >/= 1.8 L/day     Mariana Single RD, LDN Clinical Nutrition Pager # [phone]

## 2019-02-27 NOTE — ED Notes (Signed)
ED TO INPATIENT HANDOFF REPORT  ED Nurse Name and Phone #:  Aaron Edelman [phone]  S Name/Age/Gender Joel Baker 58 y.o. male Room/Bed: 016C/016C  Code Status   Code Status: Full Code  Home/SNF/Other Home Patient oriented to: self, place, time and situation Is this baseline? Yes   Triage Complete: Triage complete  Chief Complaint upper abd pain; weight loss  Triage Note Pt arrives from Urgent care for further evaluation. Pt reports feeling weak, having abdominal pain, and having a decreased appetite. Per d/c paperwork, pt needs evaluation for possible dehydration and acute kidney failure.   Allergies No Known Allergies  Level of Care/Admitting Diagnosis ED Disposition    ED Disposition Condition [facility] Area: [place] [[id number]]  Level of Care: Progressive [102]  Covid Evaluation: N/A  Diagnosis: Pancreatitis [226333]  Admitting Physician: Matilde Haymaker [[id number]]  Attending Physician: Madison Hickman A [[id number]]  Estimated length of stay: past midnight tomorrow  Certification:: I certify this patient will need inpatient services for at least 2 midnights  PT Class (Do Not Modify): Inpatient [101]  PT Acc Code (Do Not Modify): Private [1]       B Medical/Surgery History Past Medical History:  Diagnosis Date  . Hypertension    History reviewed. No pertinent surgical history.   A IV Location/Drains/Wounds Patient Lines/Drains/Airways Status   Active Line/Drains/Airways    Name:   Placement date:   Placement time:   Site:   Days:   Peripheral IV 02/26/19 Right Antecubital   02/26/19    1721    Antecubital   1          Intake/Output Last 24 hours  Intake/Output Summary (Last 24 hours) at 02/27/2019 0005 Last data filed at 02/26/2019 1813 Gross per 24 hour  Intake 1000 ml  Output -  Net 1000 ml    Labs/Imaging Results for orders placed or performed during the hospital encounter of 02/26/19 (from the past 48 hour(s))  Troponin I  - ONCE - STAT     Status: Abnormal   Collection Time: 02/26/19  5:37 PM  Result Value Ref Range   Troponin I 0.06 (HH) <0.03 ng/mL    Comment: CRITICAL RESULT CALLED TO, READ BACK BY AND VERIFIED WITH: Earl Gala 1823 02/26/2019 WBOND Performed at [facility], [place] [address], [place], [place] [address]   Troponin I - ONCE - STAT     Status: Abnormal   Collection Time: 02/26/19  8:19 PM  Result Value Ref Range   Troponin I 0.04 (HH) <0.03 ng/mL    Comment: CRITICAL VALUE NOTED.  VALUE IS CONSISTENT WITH PREVIOUSLY REPORTED AND CALLED VALUE. Performed at [facility], [place] [address], [place], [place] [address]   Lipase, blood     Status: None   Collection Time: 02/26/19  9:26 PM  Result Value Ref Range   Lipase 29 11 - 51 U/L    Comment: Performed at [facility], [place] [address], [place], [place] [address]  Troponin I - Now Then Q6H     Status: Abnormal   Collection Time: 02/26/19  9:26 PM  Result Value Ref Range   Troponin I 0.05 (HH) <0.03 ng/mL    Comment: CRITICAL VALUE NOTED.  VALUE IS CONSISTENT WITH PREVIOUSLY REPORTED AND CALLED VALUE. Performed at [facility], [place] [address] [place],  [address]   Urinalysis, Complete w Microscopic     Status: Abnormal   Collection Time: 02/26/19  11:10 PM  Result Value Ref Range   Color, Urine YELLOW YELLOW   APPearance HAZY (A) CLEAR   Specific Gravity, Urine 1.015 1.005 - 1.030   pH 5.0 5.0 - 8.0   Glucose, UA NEGATIVE NEGATIVE mg/dL   Hgb urine dipstick SMALL (A) NEGATIVE   Bilirubin Urine NEGATIVE NEGATIVE   Ketones, ur 5 (A) NEGATIVE mg/dL   Protein, ur NEGATIVE NEGATIVE mg/dL   Nitrite NEGATIVE NEGATIVE   Leukocytes,Ua NEGATIVE NEGATIVE   RBC / HPF 6-10 0 - 5 RBC/hpf   WBC, UA 21-50 0 - 5 WBC/hpf   Bacteria, UA RARE (A) NONE SEEN   Squamous Epithelial / LPF 0-5 0 - 5   Mucus PRESENT    Hyaline Casts, UA PRESENT     Comment: Performed at [facility] [address]  [place], [place] [address]   Ct Abdomen Pelvis Wo Contrast  Result Date: 02/26/2019 CLINICAL DATA:  58 y/o  M; nausea, vomiting, abdominal pain. EXAM: CT ABDOMEN AND PELVIS WITHOUT CONTRAST TECHNIQUE: Multidetector CT imaging of the abdomen and pelvis was performed following the standard protocol without IV contrast. COMPARISON:  None. FINDINGS: Lower chest: No acute abnormality. Hepatobiliary: No focal liver abnormality is seen. No gallstones, gallbladder wall thickening, or biliary dilatation. Pancreas: Extensive edema surrounding the pancreas, throughout the retroperitoneum and into the mesentery no main duct dilatation or hypoattenuation of the pancreas. There is a fluid collection which extends along greater curvature of the stomach and the body of the pancreas spanning up to 8 cm (series 3, image 16 and series 6, image 45). Spleen: Normal in size without focal abnormality. Adrenals/Urinary Tract: Left adrenal mass measuring 3.5 cm and 18 HU (series 3, image 22). Normal right adrenal gland. No focal kidney lesion identified. No urinary stone disease or hydronephrosis. Normal bladder. Stomach/Bowel: Stomach appears unremarkable on this noncontrast examination. Appendix appears normal. No evidence of bowel wall thickening, distention, or inflammatory changes. Vascular/Lymphatic: Aortic atherosclerosis. No enlarged abdominal or pelvic lymph nodes. Reproductive: Prostate is unremarkable. Other: No abdominal wall hernia. Musculoskeletal: No fracture is seen. IMPRESSION: 1. Extensive edema surrounding the pancreas, left posterior retroperitoneum, and mesentery. Fluid collection along greater curvature of stomach and body of pancreas. Findings favor acute pancreatitis with acute peripancreatic collection, less likely penetrating gastric ulcer. 2. 3.5 cm indeterminate left adrenal mass. Further characterization with adrenal protocol CT or MRI is recommended on a nonemergent basis. This recommendation follows ACR  consensus guidelines: Management of Incidental Adrenal Masses: A White Paper of the ACR Incidental Findings Committee. J Am Coll Radiol 2017;14:1038-1044. Electronically Signed   By: Kristine Garbe M.D.   On: 02/26/2019 20:31   Dg Chest Portable 1 View  Result Date: 02/26/2019 CLINICAL DATA:  Shortness of breath.  Elevated heart rate. EXAM: PORTABLE CHEST 1 VIEW COMPARISON:  02/26/2019 acute abdomen series FINDINGS: Hyperinflation. Midline trachea. Normal heart size and mediastinal contours. No pleural effusion or pneumothorax. Possible 8 mm nodular density projecting over the right lung base. Numerous leads and wires project over the chest. IMPRESSION: Hyperinflation, without acute disease. Possible nodular density projecting over the right lung base. Consider PA and lateral radiographs after removal of all leads and wires. Electronically Signed   By: Abigail Miyamoto M.D.   On: 02/26/2019 18:14   Dg Abd Acute W/chest  Result Date: 02/26/2019 CLINICAL DATA:  Shortness of breath, abdominal pain, diarrhea. EXAM: DG ABDOMEN ACUTE W/ 1V CHEST COMPARISON:  None. FINDINGS: There is no evidence of dilated bowel loops or  free intraperitoneal air. No radiopaque calculi or other significant radiographic abnormality is seen. Heart size and mediastinal contours are within normal limits. Both lungs are clear. IMPRESSION: No evidence of bowel obstruction or ileus. No acute cardiopulmonary disease. Electronically Signed   By: Marijo Conception M.D.   On: 02/26/2019 15:41    Pending Labs Unresulted Labs (From admission, onward)    Start     Ordered   02/27/19 0500  TSH  Tomorrow morning,   R     02/26/19 2130   02/27/19 0500  Hemoglobin A1c  Tomorrow morning,   R     02/26/19 2130   02/27/19 0500  Lipid panel  Tomorrow morning,   R     02/26/19 2130   02/27/19 0500  CBC  Tomorrow morning,   R     02/26/19 2130   02/27/19 0500  Comprehensive metabolic panel  Tomorrow morning,   R     02/26/19 2130    02/26/19 2326  Ethanol  Add-on,   R     02/26/19 2325   02/26/19 2136  Troponin I - Now Then Q6H  Now then every 6 hours,   R     02/26/19 2135   02/26/19 2120  Sodium, urine, random  Once,   R     02/26/19 2130   02/26/19 2109  HIV antibody (Routine Testing)  Once,   R     02/26/19 2130   02/26/19 1832  Rapid urine drug screen (hospital performed)  ONCE - STAT,   R     02/26/19 1831          Vitals/Pain Today's Vitals   02/26/19 2145 02/26/19 2200 02/26/19 2245 02/26/19 2331  BP: (!) 130/95 124/90 124/82   Pulse: (!) 101 99    Resp: 19 18 10    Temp:      TempSrc:      SpO2: 99% 92%    Weight:      Height:      PainSc:    0-No pain    Isolation Precautions No active isolations  Medications Medications  diltiazem (CARDIZEM) 100 mg in dextrose 5% 154mL (1 mg/mL) infusion (15 mg/hr Intravenous Rate/Dose Verify 02/26/19 2258)  enoxaparin (LOVENOX) injection 30 mg (has no administration in time range)  acetaminophen (TYLENOL) tablet 650 mg (has no administration in time range)    Or  acetaminophen (TYLENOL) suppository 650 mg (has no administration in time range)  ondansetron (ZOFRAN) tablet 4 mg (has no administration in time range)    Or  ondansetron (ZOFRAN) injection 4 mg (has no administration in time range)  thiamine (VITAMIN B-1) tablet 100 mg ( Oral See Alternative 02/26/19 2208)    Or  thiamine (B-1) injection 100 mg (100 mg Intravenous Given 02/27/39 8144)  folic acid (FOLVITE) tablet 1 mg (1 mg Oral Not Given 02/26/19 2209)  multivitamin with minerals tablet 1 tablet (1 tablet Oral Not Given 02/26/19 2209)  0.9 %  sodium chloride infusion ( Intravenous New Bag/Given 02/26/19 2210)  pantoprazole (PROTONIX) injection 40 mg (40 mg Intravenous Given 02/26/19 2208)  diltiazem (CARDIZEM) 1 mg/mL load via infusion 20 mg (20 mg Intravenous Bolus from Bag 02/26/19 1749)  sodium chloride 0.9 % bolus 1,000 mL (0 mLs Intravenous Stopped 02/26/19 1813)  sodium chloride 0.9 % bolus  1,000 mL (1,000 mLs Intravenous New Bag/Given 02/26/19 2210)    Mobility walks Moderate fall risk   Focused Assessments Cardiac Assessment Handoff:    Lab Results  Component Value Date   TROPONINI 0.05 ([place]) 02/26/2019   No results found for: DDIMER Does the Patient currently have chest pain? No  , Neuro Assessment Handoff:  Swallow screen pass? Yes          Neuro Assessment: Within Defined Limits Neuro Checks:      Last Documented NIHSS Modified Score:   Has TPA been given? No If patient is a Neuro Trauma and patient is going to OR before floor call report to [place] nurse: [phone] or [phone]     R Recommendations: See Admitting Provider Note  Report given to:   Additional Notes: Pt on cardizem drip maxed at 15.  Tolerating with stable BPs and resolved SVT.  No pain at this time.  Pt NPO.  Hx of ETOH abuse and not had a drink since Sunday.  CIWA Q6hr, last was CIWA of 1.  A&Ox4, ambulatory to restroom without assistance.

## 2019-02-28 ENCOUNTER — Inpatient Hospital Stay (HOSPITAL_COMMUNITY): Payer: Self-pay

## 2019-02-28 DIAGNOSIS — N179 Acute kidney failure, unspecified: Secondary | ICD-10-CM

## 2019-02-28 DIAGNOSIS — K852 Alcohol induced acute pancreatitis without necrosis or infection: Principal | ICD-10-CM

## 2019-02-28 DIAGNOSIS — R109 Unspecified abdominal pain: Secondary | ICD-10-CM

## 2019-02-28 DIAGNOSIS — R509 Fever, unspecified: Secondary | ICD-10-CM

## 2019-02-28 DIAGNOSIS — R1013 Epigastric pain: Secondary | ICD-10-CM

## 2019-02-28 DIAGNOSIS — I4892 Unspecified atrial flutter: Secondary | ICD-10-CM

## 2019-02-28 LAB — COMPREHENSIVE METABOLIC PANEL
ALT: 37 U/L (ref 0–44)
AST: 75 U/L — ABNORMAL HIGH (ref 15–41)
Albumin: 2.1 g/dL — ABNORMAL LOW (ref 3.5–5.0)
Alkaline Phosphatase: 80 U/L (ref 38–126)
Anion gap: 10 (ref 5–15)
BUN: 16 mg/dL (ref 6–20)
CO2: 25 mmol/L (ref 22–32)
Calcium: 8.9 mg/dL (ref 8.9–10.3)
Chloride: 101 mmol/L (ref 98–111)
Creatinine, Ser: 1.02 mg/dL (ref 0.61–1.24)
GFR calc Af Amer: >60 mL/min (ref >60)
GFR calc non Af Amer: >60 mL/min (ref >60)
Glucose, Bld: 96 mg/dL (ref 70–99)
Potassium: 3.1 mmol/L — ABNORMAL LOW (ref 3.5–5.1)
Sodium: 136 mmol/L (ref 135–145)
Total Bilirubin: 1.4 mg/dL — ABNORMAL HIGH (ref 0.3–1.2)
Total Protein: 5.7 g/dL — ABNORMAL LOW (ref 6.5–8.1)

## 2019-02-28 LAB — CBC
HCT: 34 % — ABNORMAL LOW (ref 39.0–52.0)
Hemoglobin: 11.9 g/dL — ABNORMAL LOW (ref 13.0–17.0)
MCH: 34.6 pg — ABNORMAL HIGH (ref 26.0–34.0)
MCHC: 35 g/dL (ref 30.0–36.0)
MCV: 98.8 fL (ref 80.0–100.0)
Platelets: 243 10*3/uL (ref 150–400)
RBC: 3.44 MIL/uL — ABNORMAL LOW (ref 4.22–5.81)
RDW: 13.2 % (ref 11.5–15.5)
WBC: 15.4 10*3/uL — ABNORMAL HIGH (ref 4.0–10.5)
nRBC: 0 % (ref 0.0–0.2)

## 2019-02-28 MED ORDER — DILTIAZEM HCL ER COATED BEADS 240 MG PO CP24
240.0000 mg | ORAL_CAPSULE | Freq: Every day | ORAL | Status: DC
  Administered 2019-03-01 – 2019-03-04 (×4): 240 mg via ORAL
  Filled 2019-02-28 (×4): qty 1

## 2019-02-28 MED ORDER — POTASSIUM CHLORIDE CRYS ER 20 MEQ PO TBCR
40.0000 meq | EXTENDED_RELEASE_TABLET | Freq: Once | ORAL | Status: AC
  Administered 2019-02-28: 40 meq via ORAL
  Filled 2019-02-28: qty 2

## 2019-02-28 MED ORDER — APIXABAN 5 MG PO TABS
5.0000 mg | ORAL_TABLET | Freq: Two times a day (BID) | ORAL | Status: DC
  Administered 2019-02-28 – 2019-03-06 (×12): 5 mg via ORAL
  Filled 2019-02-28 (×12): qty 1

## 2019-02-28 MED ORDER — ENOXAPARIN SODIUM 40 MG/0.4ML ~~LOC~~ SOLN
40.0000 mg | SUBCUTANEOUS | Status: DC
  Administered 2019-02-28: 10:00:00 40 mg via SUBCUTANEOUS
  Filled 2019-02-28: qty 0.4

## 2019-02-28 MED ORDER — PANTOPRAZOLE SODIUM 40 MG PO TBEC
40.0000 mg | DELAYED_RELEASE_TABLET | Freq: Two times a day (BID) | ORAL | Status: DC
  Administered 2019-02-28 – 2019-03-02 (×4): 40 mg via ORAL
  Filled 2019-02-28 (×4): qty 1

## 2019-02-28 NOTE — Discharge Instructions (Signed)

## 2019-02-28 NOTE — Progress Notes (Addendum)
Family Medicine Teaching Service Daily Progress Note Intern Pager: [phone]  Patient name: Joel Baker Medical record number: [id number] Date of birth: 1961-05-22 Age: 58 y.o. Gender: male  Primary Care Provider: Patient, No Pcp Per Consultants: Cardiology  Code Status: Full   Pt Overview and Major Events to Date:  Admitted to FPTS on 4/16  Assessment and Plan: Joel Baker is a 58 y.o. male presenting with SVT and pancreatitis. PMH is significant for alcohol use disorder, hypertension, sarcoidosis.  Atrial flutter with 2:1 block vs SVT, resolved Continues to be asymptomatic in sinus rhythm, HR did increase throughout the day yesterday.  IN the last 24 hrs, HR has been 91-116.  This AM HR 108.  Cardiology d/c'ed dilt drip and transitioned patient to PO dilt 180mg  QD, which he received first dose yesterday @ 1200.  Cardiology notes from the last two days state SVT and Atrial flutter, therefore unclear actual diagnosis.  Spoke with on call cardiologist, Dr. Mayford Knife, who recommended increasing dilt to 240mg  tomorrow.  Also stated "this is classic atrial flutter and patient should be anticoagulated with xarelto or eliquis." -cardiology has signed off: outpatient f/u in 2 months -on discharge will refer to Dr. Ladona Ridgel with EP as Dr. Mayford Knife notes that patient could likely benefit from ablation - will start Eliquis 5mg  BID -increase to dilt 240mg  QD starting tomorrow, 4/19 -Echo was deferred by cardiology as would not change current management -monitor vitals  Abdominal pain/Hiccups Given CT suggestive of acute pancreatitis, GI following and believes this is most likely diagnosis, 2/2 alcohol.  There is still possibility of gastric ulcer, therefore 2-view abd XR ordered for this AM.  No perforation noted on imaging.  Pain minimal.  Clear Liquids patient is tolerating clear liquids.  Spoke with GI and clarified that patient is okay to discharge today if tolerates regular diet.  Prior to discharge  orders being placed, patient did have fever to 101.  Will remain inpatient until tomorrow to monitor for further elevated temp. - ADAT, Regular for lunch -d/c fluids, as is taking good PO -Pantoprazole 40 mg twice daily -Tylenol for pain consider increasing pain regimen (very mild pain on admission) -GI consulted, appreciate recommendations    Fever Febirle to 101 at 1100, improved with tylenol.  UA with WBC, therefore will obtain Urine Culture, although patient denies dysuria.  Does endorse increased frequency, although he has been on IVF.  CXR negative for acute process on exam on 4/16.  Could be 2/2 acute pancreatitis if severe, therefore would hold off on Abx.  WBC is improving, down to 15.4 from 17.9 on admission. - urine culture - 2 view CXR - monitor vitals   Acute kidney injury: Resolved Creatinine 4.2>2.3>1.02.  Last value from 2014 Cr 1.  Was likely in setting of decrease PO intake.   -d/c IVF -f/u BMP  Hypertension, chronic Had not taken HCTZ in last 43-month as outpatient.  BP remains normotensive, this a.m. 129/77. -Continue to monitor  Alcohol use disorder He reports drinking three 12 ounce beers and 2 glasses of wine daily.  He reports his last drink was on 4/10.  He denies ever having withdrawal symptoms in the past. 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  Is not currently taking any medication for this. -outpatient f/u  Hypokalemia K3.1 today.  Magnesium 1.9 yesterday. -Follow-up BMP -K-Dur 40 mEq  x 1  FEN/GI:  Clears, ADAT Prophylaxis: Lovenox  Disposition: Likely home tomorrow pending ability to tolerate diet and that he reamins afebrile, spoke with GI and okay to d/c  Subjective:  Patient notes minimal abdominal pain.  Does states that he is feeling "gas" sensation in his abdomen, otherwise has no  complaints.  Objective: Temp:  [98.6 F (37 C)-100.3 F (37.9 C)] 100.3 F (37.9 C) (04/18 0410) Pulse Rate:  [97-110] 110 (04/18 0410) Resp:  [18-23] 23 (04/18 0410) BP: (121-144)/(77-87) 129/77 (04/18 0410) SpO2:  [95 %-100 %] 100 % (04/18 0410) Weight:  [59 kg] 59 kg (04/18 0420)  Physical Exam: General: 58 y.o. male in NAD Cardio: RRR no m/r/g Lungs: CTAB, no wheezing, no rhonchi, no crackles Abdomen: Soft, TTP epigastric region, positive bowel sounds Skin: warm and dry Extremities: No edema   Laboratory: Recent Labs  Lab 02/26/19 1509 02/27/19 0319 02/28/19 0245  WBC 17.9* 15.6* 15.4*  HGB 16.6 13.2 11.9*  HCT 46.9 36.8* 34.0*  PLT 283 250 243   Recent Labs  Lab 02/26/19 1509 02/27/19 0319 02/28/19 0245  NA 131* 134* 136  K 4.0 4.3 3.1*  CL 91* 103 101  CO2 24 18* 25  BUN 79* 62* 16  CREATININE 4.20* 2.31* 1.02  CALCIUM 10.8* 9.6 8.9  PROT 8.4* 6.7 5.7*  BILITOT 2.5* 1.5* 1.4*  ALKPHOS 103 82 80  ALT 56* 39 37  AST 153* 63* 75*  GLUCOSE 184* 122* 96     Imaging/Diagnostic Tests: Ct Abdomen Pelvis Wo Contrast  Result Date: 02/26/2019 CLINICAL DATA:  58 y/o  M; nausea, vomiting, abdominal pain. EXAM: CT ABDOMEN AND PELVIS WITHOUT CONTRAST TECHNIQUE: Multidetector CT imaging of the abdomen and pelvis was performed following the standard protocol without IV contrast. COMPARISON:  None. FINDINGS: Lower chest: No acute abnormality. Hepatobiliary: No focal liver abnormality is seen. No gallstones, gallbladder wall thickening, or biliary dilatation. Pancreas: Extensive edema surrounding the pancreas, throughout the retroperitoneum and into the mesentery no main duct dilatation or hypoattenuation of the pancreas. There is a fluid collection which extends along greater curvature of the stomach and the body of the pancreas spanning up to 8 cm (series 3, image 16 and series 6, image 45). Spleen: Normal in size without focal abnormality. Adrenals/Urinary Tract: Left  adrenal mass measuring 3.5 cm and 18 HU (series 3, image 22). Normal right adrenal gland. No focal kidney lesion identified. No urinary stone disease or hydronephrosis. Normal bladder. Stomach/Bowel: Stomach appears unremarkable on this noncontrast examination. Appendix appears normal. No evidence of bowel wall thickening, distention, or inflammatory changes. Vascular/Lymphatic: Aortic atherosclerosis. No enlarged abdominal or pelvic lymph nodes. Reproductive: Prostate is unremarkable. Other: No abdominal wall hernia. Musculoskeletal: No fracture is seen. IMPRESSION: 1. Extensive edema surrounding the pancreas, left posterior retroperitoneum, and mesentery. Fluid collection along greater curvature of stomach and body of pancreas. Findings favor acute pancreatitis with acute peripancreatic collection, less likely penetrating gastric ulcer. 2. 3.5 cm indeterminate left adrenal mass. Further characterization with adrenal protocol CT or MRI is recommended on a nonemergent basis. This recommendation follows ACR consensus guidelines: Management of Incidental Adrenal Masses: A White Paper of the ACR Incidental Findings Committee. J Am Coll Radiol 2017;14:1038-1044. Electronically Signed   By: Mitzi Hansen M.D.   On: 02/26/2019 20:31   Dg Chest Portable 1 View  Result Date: 02/26/2019 CLINICAL DATA:  Shortness of breath.  Elevated heart rate. EXAM: PORTABLE CHEST 1 VIEW COMPARISON:  02/26/2019 acute abdomen series FINDINGS: Hyperinflation. Midline  trachea. Normal heart size and mediastinal contours. No pleural effusion or pneumothorax. Possible 8 mm nodular density projecting over the right lung base. Numerous leads and wires project over the chest. IMPRESSION: Hyperinflation, without acute disease. Possible nodular density projecting over the right lung base. Consider PA and lateral radiographs after removal of all leads and wires. Electronically Signed   By: Jeronimo Greaves M.D.   On: 02/26/2019 18:14   Dg  Abd Acute W/chest  Result Date: 02/26/2019 CLINICAL DATA:  Shortness of breath, abdominal pain, diarrhea. EXAM: DG ABDOMEN ACUTE W/ 1V CHEST COMPARISON:  None. FINDINGS: There is no evidence of dilated bowel loops or free intraperitoneal air. No radiopaque calculi or other significant radiographic abnormality is seen. Heart size and mediastinal contours are within normal limits. Both lungs are clear. IMPRESSION: No evidence of bowel obstruction or ileus. No acute cardiopulmonary disease. Electronically Signed   By: Lupita Raider M.D.   On: 02/26/2019 15:41   Dg Kayleen Memos W Double Cm (hd Ba)  Result Date: 02/27/2019 CLINICAL DATA:  Abdominal pain. Rule out pancreatitis or gastric ulcer on CT. EXAM: UPPER GI SERIES WITH KUB TECHNIQUE: After obtaining a scout radiograph a routine upper GI series was performed using thin and high density barium. FLUOROSCOPY TIME:  Fluoroscopy Time:  1 minutes 48 seconds Radiation Exposure Index (if provided by the fluoroscopic device): Number of Acquired Spot Images: 0 COMPARISON:  CT abdomen pelvis 02/26/2019 FINDINGS: Preliminary KUB demonstrates mildly distended colon and small bowel. Esophageal mucosa and motility normal. No esophageal stricture or mass. Negative for hiatal hernia or reflux Negative for gastric ulcer or mass.  No significant mucosal edema. Barium emptied readily into the small bowel. Negative for duodenal ulcer. Mild dilatation of the proximal small bowel including the duodenum and jejunum. IMPRESSION: 1. Negative for gastric ulcer.  Negative for duodenal ulcer 2. Normal esophagus 3. Mild dilatation of the small bowel raising the possibility of partial small bowel obstruction. Review of prior studies does not reveal definite dilatation of the jejunum on recent CT. Recommend two-view abdomen in AM for follow-up. Electronically Signed   By: Marlan Palau M.D.   On: 02/27/2019 15:57     Dessa Ledee, Solmon Ice, DO 02/28/2019, 7:49 AM PGY-1, [place] [facility] FPTS Intern pager: [phone], text pages welcome

## 2019-02-28 NOTE — Progress Notes (Signed)
Progress note for [facility]  Subjective: Feeling well.  No complaints.  Objective: Vital signs in last 24 hours: Temp:  [98.6 F (37 C)-101 F (38.3 C)] 101 F (38.3 C) (04/18 1105) Pulse Rate:  [97-110] 107 (04/18 0822) Resp:  [18-23] 20 (04/18 1105) BP: (121-146)/(77-89) 146/89 (04/18 1105) SpO2:  [99 %-100 %] 99 % (04/18 0822) Weight:  [59 kg] 59 kg (04/18 0420) Last BM Date: 02/27/19  Intake/Output from previous day: 04/17 0701 - 04/18 0700 In: 710.8 [P.O.:360; I.V.:350.8] Out: 650 [Urine:650] Intake/Output this shift: Total I/O In: -  Out: 350 [Urine:350]  General appearance: alert and no distress GI: soft, non-tender; bowel sounds normal; no masses,  no organomegaly  Lab Results: Recent Labs    02/26/19 1509 02/27/19 0319 02/28/19 0245  WBC 17.9* 15.6* 15.4*  HGB 16.6 13.2 11.9*  HCT 46.9 36.8* 34.0*  PLT 283 250 243   BMET Recent Labs    02/26/19 1509 02/27/19 0319 02/28/19 0245  NA 131* 134* 136  K 4.0 4.3 3.1*  CL 91* 103 101  CO2 24 18* 25  GLUCOSE 184* 122* 96  BUN 79* 62* 16  CREATININE 4.20* 2.31* 1.02  CALCIUM 10.8* 9.6 8.9   LFT Recent Labs    02/28/19 0245  PROT 5.7*  ALBUMIN 2.1*  AST 75*  ALT 37  ALKPHOS 80  BILITOT 1.4*   PT/INR No results for input(s): LABPROT, INR in the last 72 hours. Hepatitis Panel No results for input(s): HEPBSAG, HCVAB, HEPAIGM, HEPBIGM in the last 72 hours. C-Diff No results for input(s): CDIFFTOX in the last 72 hours. Fecal Lactopherrin No results for input(s): FECLLACTOFRN in the last 72 hours.  Studies/Results: Ct Abdomen Pelvis Wo Contrast  Result Date: 02/26/2019 CLINICAL DATA:  58 y/o  M; nausea, vomiting, abdominal pain. EXAM: CT ABDOMEN AND PELVIS WITHOUT CONTRAST TECHNIQUE: Multidetector CT imaging of the abdomen and pelvis was performed following the standard protocol without IV contrast. COMPARISON:  None. FINDINGS: Lower chest: No acute abnormality. Hepatobiliary: No focal liver  abnormality is seen. No gallstones, gallbladder wall thickening, or biliary dilatation. Pancreas: Extensive edema surrounding the pancreas, throughout the retroperitoneum and into the mesentery no main duct dilatation or hypoattenuation of the pancreas. There is a fluid collection which extends along greater curvature of the stomach and the body of the pancreas spanning up to 8 cm (series 3, image 16 and series 6, image 45). Spleen: Normal in size without focal abnormality. Adrenals/Urinary Tract: Left adrenal mass measuring 3.5 cm and 18 HU (series 3, image 22). Normal right adrenal gland. No focal kidney lesion identified. No urinary stone disease or hydronephrosis. Normal bladder. Stomach/Bowel: Stomach appears unremarkable on this noncontrast examination. Appendix appears normal. No evidence of bowel wall thickening, distention, or inflammatory changes. Vascular/Lymphatic: Aortic atherosclerosis. No enlarged abdominal or pelvic lymph nodes. Reproductive: Prostate is unremarkable. Other: No abdominal wall hernia. Musculoskeletal: No fracture is seen. IMPRESSION: 1. Extensive edema surrounding the pancreas, left posterior retroperitoneum, and mesentery. Fluid collection along greater curvature of stomach and body of pancreas. Findings favor acute pancreatitis with acute peripancreatic collection, less likely penetrating gastric ulcer. 2. 3.5 cm indeterminate left adrenal mass. Further characterization with adrenal protocol CT or MRI is recommended on a nonemergent basis. This recommendation follows ACR consensus guidelines: Management of Incidental Adrenal Masses: A White Paper of the ACR Incidental Findings Committee. J Am Coll Radiol 2017;14:1038-1044. Electronically Signed   By: Kristine Garbe M.D.   On: 02/26/2019 20:31   Dg Chest Portable  1 View  Result Date: 02/26/2019 CLINICAL DATA:  Shortness of breath.  Elevated heart rate. EXAM: PORTABLE CHEST 1 VIEW COMPARISON:  02/26/2019 acute abdomen  series FINDINGS: Hyperinflation. Midline trachea. Normal heart size and mediastinal contours. No pleural effusion or pneumothorax. Possible 8 mm nodular density projecting over the right lung base. Numerous leads and wires project over the chest. IMPRESSION: Hyperinflation, without acute disease. Possible nodular density projecting over the right lung base. Consider PA and lateral radiographs after removal of all leads and wires. Electronically Signed   By: Abigail Miyamoto M.D.   On: 02/26/2019 18:14   Dg Abd 2 Views  Result Date: 02/28/2019 CLINICAL DATA:  Left and right lower quadrant abdominal pain. EXAM: ABDOMEN - 2 VIEW COMPARISON:  Upper GI 02/27/2019.  CT abdomen and pelvis 02/26/2019. FINDINGS: Barium has migrated and is now present from the distal small bowel to the rectum. No dilated loops of bowel are seen to suggest obstruction. There is mild left-sided colonic diverticulosis. No intraperitoneal free air is identified. No acute osseous abnormality is identified. The visualized lung bases are clear. IMPRESSION: No evidence of bowel obstruction. Electronically Signed   By: Logan Bores M.D.   On: 02/28/2019 08:23   Dg Abd Acute W/chest  Result Date: 02/26/2019 CLINICAL DATA:  Shortness of breath, abdominal pain, diarrhea. EXAM: DG ABDOMEN ACUTE W/ 1V CHEST COMPARISON:  None. FINDINGS: There is no evidence of dilated bowel loops or free intraperitoneal air. No radiopaque calculi or other significant radiographic abnormality is seen. Heart size and mediastinal contours are within normal limits. Both lungs are clear. IMPRESSION: No evidence of bowel obstruction or ileus. No acute cardiopulmonary disease. Electronically Signed   By: Marijo Conception M.D.   On: 02/26/2019 15:41   Dg Duanne Limerick W Double Cm (hd Ba)  Result Date: 02/27/2019 CLINICAL DATA:  Abdominal pain. Rule out pancreatitis or gastric ulcer on CT. EXAM: UPPER GI SERIES WITH KUB TECHNIQUE: After obtaining a scout radiograph a routine upper GI  series was performed using thin and high density barium. FLUOROSCOPY TIME:  Fluoroscopy Time:  1 minutes 48 seconds Radiation Exposure Index (if provided by the fluoroscopic device): Number of Acquired Spot Images: 0 COMPARISON:  CT abdomen pelvis 02/26/2019 FINDINGS: Preliminary KUB demonstrates mildly distended colon and small bowel. Esophageal mucosa and motility normal. No esophageal stricture or mass. Negative for hiatal hernia or reflux Negative for gastric ulcer or mass.  No significant mucosal edema. Barium emptied readily into the small bowel. Negative for duodenal ulcer. Mild dilatation of the proximal small bowel including the duodenum and jejunum. IMPRESSION: 1. Negative for gastric ulcer.  Negative for duodenal ulcer 2. Normal esophagus 3. Mild dilatation of the small bowel raising the possibility of partial small bowel obstruction. Review of prior studies does not reveal definite dilatation of the jejunum on recent CT. Recommend two-view abdomen in AM for follow-up. Electronically Signed   By: Franchot Gallo M.D.   On: 02/27/2019 15:57    Medications:  Scheduled: . diltiazem  180 mg Oral Daily  . enoxaparin (LOVENOX) injection  40 mg Subcutaneous Q24H  . feeding supplement (ENSURE ENLIVE)  237 mL Oral TID BM  . folic acid  1 mg Oral Daily  . multivitamin with minerals  1 tablet Oral Daily  . pantoprazole (PROTONIX) IV  40 mg Intravenous Q12H  . thiamine  100 mg Oral Daily   Or  . thiamine  100 mg Intravenous Daily   Continuous:   Assessment/Plan: 1) ETOH pancreatitis.  2) Renal failure - resolved.   The patient is well clinically.  He does not have any pain with PO intake or palpation of his abdomen.    Plan: 1) Advance to a regular diet.  LOS: 2 days   Joel Baker D 02/28/2019, 11:29 AM

## 2019-03-01 ENCOUNTER — Inpatient Hospital Stay (HOSPITAL_COMMUNITY): Payer: Self-pay

## 2019-03-01 DIAGNOSIS — R1012 Left upper quadrant pain: Secondary | ICD-10-CM

## 2019-03-01 LAB — CBC
HCT: 31.8 % — ABNORMAL LOW (ref 39.0–52.0)
Hemoglobin: 11.3 g/dL — ABNORMAL LOW (ref 13.0–17.0)
MCH: 35.3 pg — ABNORMAL HIGH (ref 26.0–34.0)
MCHC: 35.5 g/dL (ref 30.0–36.0)
MCV: 99.4 fL (ref 80.0–100.0)
Platelets: 290 10*3/uL (ref 150–400)
RBC: 3.2 MIL/uL — ABNORMAL LOW (ref 4.22–5.81)
RDW: 13.1 % (ref 11.5–15.5)
WBC: 20.8 10*3/uL — ABNORMAL HIGH (ref 4.0–10.5)
nRBC: 0 % (ref 0.0–0.2)

## 2019-03-01 LAB — BASIC METABOLIC PANEL
Anion gap: 9 (ref 5–15)
BUN: 5 mg/dL — ABNORMAL LOW (ref 6–20)
CO2: 24 mmol/L (ref 22–32)
Calcium: 8.7 mg/dL — ABNORMAL LOW (ref 8.9–10.3)
Chloride: 98 mmol/L (ref 98–111)
Creatinine, Ser: 0.92 mg/dL (ref 0.61–1.24)
GFR calc Af Amer: >60 mL/min (ref >60)
GFR calc non Af Amer: >60 mL/min (ref >60)
Glucose, Bld: 96 mg/dL (ref 70–99)
Potassium: 3 mmol/L — ABNORMAL LOW (ref 3.5–5.1)
Sodium: 131 mmol/L — ABNORMAL LOW (ref 135–145)

## 2019-03-01 MED ORDER — IOHEXOL 300 MG/ML  SOLN
100.0000 mL | Freq: Once | INTRAMUSCULAR | Status: AC | PRN
  Administered 2019-03-01: 100 mL via INTRAVENOUS

## 2019-03-01 MED ORDER — POTASSIUM CHLORIDE CRYS ER 20 MEQ PO TBCR
40.0000 meq | EXTENDED_RELEASE_TABLET | Freq: Three times a day (TID) | ORAL | Status: AC
  Administered 2019-03-01 (×3): 40 meq via ORAL
  Filled 2019-03-01 (×3): qty 2

## 2019-03-01 NOTE — Progress Notes (Signed)
Family Medicine Teaching Service Daily Progress Note Intern Pager: [phone]  Patient name: Joel Baker Medical record number: [id number] Date of birth: 1961/01/13 Age: 58 y.o. Gender: male  Primary Care Provider: Patient, No Pcp Per Consultants: Cardiology  Code Status: Full   Pt Overview and Major Events to Date:  Admitted to [facility] on 4/16  Assessment and Plan: Joel Baker is a 58 y.o. male presenting with SVT and pancreatitis. PMH is significant for alcohol use disorder, hypertension, sarcoidosis.  Atrial flutter with 2:1 block: resolved Started on Eliquis and diltiazem increased to 240 mg daily for dose this morning due to increased heart rate.  Risk-benefit discussion had with patient yesterday, he consented to Eliquis.  Remains in sinus rhythm.  HR this a.m. 114, prior to diltiazem. -cardiology has signed off: outpatient f/u in 2 months -on discharge will refer to Dr. Lovena Le with EP as Dr. Radford Pax notes that patient could likely benefit from ablation - cont  Eliquis 5mg  BID -Continue diltiazem 240 mg daily -Echo was deferred by cardiology as would not change current management -monitor vitals  Acute pancreatitis Patient tolerating p.o. diet well.  Spoke with Dr. Benson Norway over the phone regarding patient's fever.  He notes that this acute pancreatitis is likely not the source of patient's fever given his overall improved clinical appearance.  Would recommend observation from GI perspective. -Continue regular diet -Pantoprazole 40 mg twice daily -Tylenol for pain consider increasing pain regimen (very mild pain on admission) -GI consulted, appreciate recommendations    Fever Again febrile to 100.8 at 0300. Repeat CXR show possible b/l pleural effusions, although not obvious and doubt this is cause of fever.  WBC increased from 15.4 to 20.8.  Urine cx pending although patient no longer endorses increased frequency of urination now that he is no longer on IV fluids.  Continues to deny  dysuria.  Does note some left upper quadrant pain which he states is "gas."  States that this improves with flatulence.  On exam, did note to be mildly tender in the left upper quadrant and had some mild possible CVA tenderness.  Would expect patient to be much more sick if he had pyelonephritis, therefore doubt this as cause of his fever.  On CT on 4/16, patient was noted to have 3.5 cm mass on left adrenal gland.  Spoke with radiology, who recommended adrenal CT for further imaging of this.  Suspect that this could be abscess that could be source of fever.  Very reassured by patient's overall state as he is very well-appearing at this time and tolerating a p.o. diet.  Spoke with GI who agree that this is likely not acute pancreatitis causing fever. - urine culture pending -Adrenal CT -Continue to monitor fever curve - monitor vitals  Hypertension, chronic Had not taken HCTZ in last 74-month as outpatient.  BP this a.m. 134/85. -Continue to monitor  Alcohol use disorder He reports drinking three 12 ounce beers and 2 glasses of wine daily.  He reports his last drink was on 4/10.  He denies ever having withdrawal symptoms in the past. 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  Is not currently taking any medication for this. -outpatient f/u  Hypokalemia K 3.0 today.  Magnesium 1.9 on 4/17. -Follow-up BMP -K-Dur 40 mEq x 3  FEN/GI:  Clears, ADAT Prophylaxis: Lovenox  Disposition: remain inpatient and  monitor fever  Subjective:  Patient states overall he is feeling well.  States that he is tolerating his diet well.  Occasionally has left upper quadrant pain which he states is "gas."  Denies dysuria.  States that his frequency of urination is down to normal now that he is not on IV fluids.  Objective: Temp:  [99.6 F (37.6 C)-101 F (38.3 C)] 99.6 F  (37.6 C) (04/19 0825) Pulse Rate:  [94-103] 96 (04/19 0825) Resp:  [16-22] 22 (04/19 0825) BP: (130-146)/(85-96) 134/85 (04/19 0825) SpO2:  [96 %-98 %] 97 % (04/19 0825) Weight:  [59.1 kg] 59.1 kg (04/19 0430)  Physical Exam: General: 58 y.o. male in NAD, sitting up in bed Cardio: Sinus tachycardia no m/r/g Lungs: CTAB, no wheezing, no rhonchi, no crackles, no increased work of breathing on room air Abdomen: Soft, TTP left upper quadrant, positive bowel sounds, left mild CVA tenderness Skin: warm and dry Extremities: No edema   Laboratory: Recent Labs  Lab 02/27/19 0319 02/28/19 0245 03/01/19 0311  WBC 15.6* 15.4* 20.8*  HGB 13.2 11.9* 11.3*  HCT 36.8* 34.0* 31.8*  PLT 250 243 290   Recent Labs  Lab 02/26/19 1509 02/27/19 0319 02/28/19 0245 03/01/19 0311  NA 131* 134* 136 131*  K 4.0 4.3 3.1* 3.0*  CL 91* 103 101 98  CO2 24 18* 25 24  BUN 79* 62* 16 5*  CREATININE 4.20* 2.31* 1.02 0.92  CALCIUM 10.8* 9.6 8.9 8.7*  PROT 8.4* 6.7 5.7*  --   BILITOT 2.5* 1.5* 1.4*  --   ALKPHOS 103 82 80  --   ALT 56* 39 37  --   AST 153* 63* 75*  --   GLUCOSE 184* 122* 96 96     Imaging/Diagnostic Tests: Dg Chest 2 View  Result Date: 02/28/2019 CLINICAL DATA:  58 year old male with SVT, fever. EXAM: CHEST - 2 VIEW COMPARISON:  CT Abdomen and Pelvis and AP chest radiograph 02/26/2019. FINDINGS: Upright AP and lateral views of the chest. New barium type contrast at the splenic flexure which is nondilated. No pneumoperitoneum is evident. On the lateral view there may be small pleural effusions, and mildly lower lung volumes but otherwise the lungs appear clear. Mediastinal contours remain normal. Visualized tracheal air column is within normal limits. No acute osseous abnormality identified. IMPRESSION: Possible new small pleural effusions, but no other acute cardiopulmonary abnormality. Electronically Signed   By: Genevie Ann M.D.   On: 02/28/2019 17:33     Keller, Bernita Raisin,  DO 03/01/2019, 9:03 AM PGY-1, [place] Intern pager: [phone], text pages welcome

## 2019-03-02 ENCOUNTER — Inpatient Hospital Stay (HOSPITAL_COMMUNITY): Payer: Self-pay

## 2019-03-02 LAB — CBC WITH DIFFERENTIAL/PLATELET
Abs Immature Granulocytes: 0.84 10*3/uL — ABNORMAL HIGH (ref 0.00–0.07)
Basophils Absolute: 0.1 10*3/uL (ref 0.0–0.1)
Basophils Relative: 0 %
Eosinophils Absolute: 0 10*3/uL (ref 0.0–0.5)
Eosinophils Relative: 0 %
HCT: 33.6 % — ABNORMAL LOW (ref 39.0–52.0)
Hemoglobin: 11.6 g/dL — ABNORMAL LOW (ref 13.0–17.0)
Immature Granulocytes: 4 %
Lymphocytes Relative: 4 %
Lymphs Abs: 1 10*3/uL (ref 0.7–4.0)
MCH: 34.1 pg — ABNORMAL HIGH (ref 26.0–34.0)
MCHC: 34.5 g/dL (ref 30.0–36.0)
MCV: 98.8 fL (ref 80.0–100.0)
Monocytes Absolute: 1.4 10*3/uL — ABNORMAL HIGH (ref 0.1–1.0)
Monocytes Relative: 6 %
Neutro Abs: 18.8 10*3/uL — ABNORMAL HIGH (ref 1.7–7.7)
Neutrophils Relative %: 86 %
Platelets: 343 10*3/uL (ref 150–400)
RBC: 3.4 MIL/uL — ABNORMAL LOW (ref 4.22–5.81)
RDW: 13.1 % (ref 11.5–15.5)
WBC: 22.1 10*3/uL — ABNORMAL HIGH (ref 4.0–10.5)
nRBC: 0 % (ref 0.0–0.2)

## 2019-03-02 LAB — BASIC METABOLIC PANEL
Anion gap: 8 (ref 5–15)
BUN: 5 mg/dL — ABNORMAL LOW (ref 6–20)
CO2: 25 mmol/L (ref 22–32)
Calcium: 9 mg/dL (ref 8.9–10.3)
Chloride: 99 mmol/L (ref 98–111)
Creatinine, Ser: 0.91 mg/dL (ref 0.61–1.24)
GFR calc Af Amer: >60 mL/min (ref >60)
GFR calc non Af Amer: >60 mL/min (ref >60)
Glucose, Bld: 101 mg/dL — ABNORMAL HIGH (ref 70–99)
Potassium: 4 mmol/L (ref 3.5–5.1)
Sodium: 132 mmol/L — ABNORMAL LOW (ref 135–145)

## 2019-03-02 LAB — URINE CULTURE: Culture: NO GROWTH

## 2019-03-02 MED ORDER — PANTOPRAZOLE SODIUM 40 MG PO TBEC
40.0000 mg | DELAYED_RELEASE_TABLET | Freq: Every day | ORAL | Status: DC
  Administered 2019-03-03 – 2019-03-06 (×4): 40 mg via ORAL
  Filled 2019-03-02 (×4): qty 1

## 2019-03-02 NOTE — Progress Notes (Addendum)
Referral received for pricing on Eliquis- pt does not have insurance and would be paying total cost of drug out of pocket- would need to use 30 day free card to fill on discharge and apply for pt assistance if stays on Eliquis long term- CM will provide pt with application for pt assistance.

## 2019-03-02 NOTE — Progress Notes (Signed)
Patient ID: Joel Baker, male   DOB: 1961-02-08, 58 y.o.   MRN: [id number]    Progress Note   Subjective  Patient says he is feeling better, he is able to eat, no nausea or vomiting but does have some early satiety. Denies any abdominal pain   Objective   Vital signs in last 24 hours: Temp:  [98.9 F (37.2 C)-99.5 F (37.5 C)] 99.2 F (37.3 C) (04/20 0936) Pulse Rate:  [100-101] 101 (04/20 0419) Resp:  [19-27] 22 (04/20 0419) BP: (123-128)/(85-95) 128/95 (04/20 0936) SpO2:  [98 %-100 %] 98 % (04/20 0419) Weight:  [58.3 kg] 58.3 kg (04/20 0520) Last BM Date: 02/28/19 General:    AA male  in NAD,  Heart: tachy Regular rate and rhythm; no murmurs Lungs: Respirations even and unlabored, lungs CTA bilaterally Abdomen:  Soft, nontender and nondistended. Normal bowel sounds. Extremities:  Without edema. Neurologic:  Alert and oriented,  grossly normal neurologically. Psych:  Cooperative. Normal mood and affect.  Intake/Output from previous day: 04/19 0701 - 04/20 0700 In: 300 [P.O.:300] Out: 500 [Urine:500] Intake/Output this shift: No intake/output data recorded.  Lab Results: Recent Labs    02/28/19 0245 03/01/19 0311 03/02/19 0427  WBC 15.4* 20.8* 22.1*  HGB 11.9* 11.3* 11.6*  HCT 34.0* 31.8* 33.6*  PLT 243 290 343   BMET Recent Labs    02/28/19 0245 03/01/19 0311 03/02/19 0427  NA 136 131* 132*  K 3.1* 3.0* 4.0  CL 101 98 99  CO2 25 24 25   GLUCOSE 96 96 101*  BUN 16 5* 5*  CREATININE 1.02 0.92 0.91  CALCIUM 8.9 8.7* 9.0   LFT Recent Labs    02/28/19 0245  PROT 5.7*  ALBUMIN 2.1*  AST 75*  ALT 37  ALKPHOS 80  BILITOT 1.4*   PT/INR No results for input(s): LABPROT, INR in the last 72 hours.  Studies/Results: Dg Chest 2 View  Result Date: 02/28/2019 CLINICAL DATA:  58 year old male with SVT, fever. EXAM: CHEST - 2 VIEW COMPARISON:  CT Abdomen and Pelvis and AP chest radiograph 02/26/2019. FINDINGS: Upright AP and lateral views of the chest. New  barium type contrast at the splenic flexure which is nondilated. No pneumoperitoneum is evident. On the lateral view there may be small pleural effusions, and mildly lower lung volumes but otherwise the lungs appear clear. Mediastinal contours remain normal. Visualized tracheal air column is within normal limits. No acute osseous abnormality identified. IMPRESSION: Possible new small pleural effusions, but no other acute cardiopulmonary abnormality. Electronically Signed   By: Odessa Fleming M.D.   On: 02/28/2019 17:33   Ct Abdomen W Wo Contrast  Result Date: 03/01/2019 CLINICAL DATA:  Left adrenal mass. EXAM: CT ABDOMEN WITHOUT AND WITH CONTRAST TECHNIQUE: Multidetector CT imaging of the abdomen was performed following the standard protocol before and following the bolus administration of intravenous contrast. CONTRAST:  OMNIPAQUE IOHEXOL 300 MG/ML  SOLN COMPARISON:  03/01/2019 FINDINGS: Lower chest: Bibasilar atelectasis with small bilateral pleural effusions. Hepatobiliary: No focal abnormality identified in the liver parenchyma. Gallbladder nondistended. No intrahepatic or extrahepatic biliary dilation. Pancreas: Diffuse peripancreatic edema/fluid identified, mainly involving the body and tail. Near confluent fluid attenuation is seen along the greater curvature of the stomach and just inferior to the pancreatic tail where there is also evidence of peritoneal enhancement. Spleen: No focal parenchymal abnormality. Adrenals/Urinary Tract: Right adrenal gland unremarkable. 3.4 x 3.2 x 3.1 cm left suprarenal soft tissue mass appears to arise from the left adrenal gland. Average  attenuation of the mass is 27 Hounsfield units on precontrast imaging, 135 Hounsfield units on portal venous phase imaging and 55 Hounsfield units on delayed imaging. Absolute washout percentage of the lesion is 74% with a relative washout percentage of 59%. Both calculations are consistent with adrenal adenoma although this level of  marked enhancement can also be seen in the setting of pheochromocytoma. Kidneys unremarkable. Stomach/Bowel: Fluid noted along the lateral wall of the stomach and greater curvature. Duodenum is normally positioned as is the ligament of Treitz. No small bowel or colonic dilatation within the visualized abdomen. Vascular/Lymphatic: There is abdominal aortic atherosclerosis without aneurysm. Upper normal lymph nodes are seen in the hepato duodenal ligament. Other: None. Musculoskeletal: No worrisome lytic or sclerotic osseous abnormality. IMPRESSION: 1. 3.4 cm left adrenal nodule has washout characteristics consistent with adenoma although the high level of enhancement after IV contrast administration can also be seen in the setting of pheochromocytoma. Correlation with urine or blood metanephrine levels suggested. 2. Diffuse edema and fluid in the anterior pararenal space compatible with pancreatitis. No evidence for pancreatic necrosis. Electronically Signed   By: Kennith Center M.D.   On: 03/01/2019 15:32       Assessment / Plan:    #36 58 year old African-American male with acute/subacute alcohol induced pancreatitis with extensive peripancreatic inflammatory change.  There is no evidence of necrosis on repeat CT this weekend with contrast, no pseudocyst.  Upper GI on Friday showed no evidence of penetrating gastric ulcer.  Clinically he is improving however has had rising WBC over the past couple of days now up to 20,000. Not certain that this is secondary to pancreatitis though possible.  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             LOS: 4 days   Lilyannah Zuelke  03/02/2019, 11:25 AM

## 2019-03-02 NOTE — Progress Notes (Signed)
Spoke with patient's wife for update.    She also noted that patient has been very dyspneic and "winded" today.  She states that he will not complain because quote "he keeps saying he doesn't want to bother you."  Given patient's leukocytosis and that he has not had CXR since 4/18, will opt to repeat CXR.

## 2019-03-02 NOTE — Progress Notes (Addendum)
Family Medicine Teaching Service Daily Progress Note Intern Pager: [phone]  Patient name: Joel Baker Medical record number: [id number] Date of birth: 04/23/1961 Age: 58 y.o. Gender: male  Primary Care Provider: Patient, No Pcp Per Consultants: Cardiology  Code Status: Full   Pt Overview and Major Events to Date:  Admitted to [facility] on 4/16 Developed Fever 4/18 Adrenal CT 4/19  Assessment and Plan: Joel Baker is a 58 y.o. male presenting with SVT and pancreatitis. PMH is significant for alcohol use disorder, hypertension, sarcoidosis.  Atrial flutter with 2:1 block: resolved Heart rate appears improved with increase in diltiazem to 240 mg daily.  Heart rate over the last 24 hours 95-108. -cardiology has signed off: outpatient f/u in 2 months -on discharge will refer to Dr. Lovena Le with EP as Dr. Radford Pax notes that patient could likely benefit from ablation - cont  Eliquis 5mg  BID -Continue diltiazem 240 mg daily -Echo was deferred by cardiology as would not change current management -monitor vitals  Acute pancreatitis Continues to tolerate good p.o. diet.  GI planning to see again today.  Continues to deny abdominal pain.  Adrenal CT yesterday with evidence of acute pancreatitis including diffuse fluid and edema.  No necrosis. -Continue regular diet -Pantoprazole 40 mg twice daily -Tylenol for pain consider increasing pain regimen (very mild pain on admission) -GI consulted, appreciate recommendations    Fever/Leukocytosis  Patient has been afebrile since 0300 yesterday.  Urine Cx showing no growth.  Adrenal CT performed yesterday to assess for possible abscess, not suggestive of abscess or other source of infection.  Reassured as patient is overall very well-appearing and has now been 24 hours without fever.  WBC continues to increase,  20.8>22.1.  Etiology remains unclear.  Could consider 2/2 pancreatitis, but would like GI input. - Continue to monitor fever curve - f/u GI  recs  Adrenal nodule CT on 4/16 showed 3.5 cm mass on left adrenal gland.  Adrenal CT performed yesterday, with suspicion for pheochromocytoma.  Patient did have elevated HR and was in A flutter which could be 2/2 pheochromocytoma.   -Blood metanephrines pending, 3-6 days to result -consider outpatient endo f/u if positive   Hypertension, chronic Had not taken HCTZ in last 62-month as outpatient.  BP this AM 134/85. -Continue to monitor  Alcohol use disorder He reports drinking three 12 ounce beers and 2 glasses of wine daily.  He reports his last drink was on 4/10.  He denies ever having withdrawal symptoms in the past. 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  Is not currently taking any medication for this. -outpatient f/u  Hypokalemia: Resolved K 4.0 today.   -Follow-up BMP  Mild Hyponatremia Na 136>131>132.  Was 131 on admission.  Unsure if chronic.  Patient continues to take good PO.  Expect to continue to improve as PO intake continues to improve. - cont to monitor  SOB with swallowing Patient notes SOB only with swallowing food.  He denies difficulty swallow, choking, or odynophagia.  He is otherwise not SOB.  Will consult SLP for swallow evaluation as would like to make sure patient is not aspirating.  Reassured that he is otherwise not SOB and lungs are clear on exam. - SLP swallow eval  FEN/GI:  Regular Prophylaxis: Lovenox  Disposition: consider d/c home today pending GI recs regarding leukocytosis  Subjective:  Patient states  that he is feeling much better.  Denies abdominal pain.  Notes that he occasionally feels SOB only while eating after he swallows.  States it resolves quickly and he is otherwise not SOB.  Denies difficulty with swallowing or pain with swallowing and choking.  Objective: Temp:  [98.9 F (37.2 C)-99.6 F (37.6  C)] 98.9 F (37.2 C) (04/20 0419) Pulse Rate:  [96-101] 101 (04/20 0419) Resp:  [19-27] 22 (04/20 0419) BP: (123-134)/(85-89) 123/89 (04/20 0419) SpO2:  [97 %-100 %] 98 % (04/20 0419) Weight:  [58.3 kg] 58.3 kg (04/20 0520)  Physical Exam: General: 58 y.o. male in NAD Cardio: sinus tachycardia to 102 on exam no m/r/g Lungs: CTAB, no wheezing, no rhonchi, no crackles, no increased WOB Abdomen: Soft, non-tender to palpation, positive bowel sounds Skin: warm and dry Extremities: No edema   Laboratory: Recent Labs  Lab 02/28/19 0245 03/01/19 0311 03/02/19 0427  WBC 15.4* 20.8* 22.1*  HGB 11.9* 11.3* 11.6*  HCT 34.0* 31.8* 33.6*  PLT 243 290 343   Recent Labs  Lab 02/26/19 1509 02/27/19 0319 02/28/19 0245 03/01/19 0311 03/02/19 0427  NA 131* 134* 136 131* 132*  K 4.0 4.3 3.1* 3.0* 4.0  CL 91* 103 101 98 99  CO2 24 18* 25 24 25   BUN 79* 62* 16 5* 5*  CREATININE 4.20* 2.31* 1.02 0.92 0.91  CALCIUM 10.8* 9.6 8.9 8.7* 9.0  PROT 8.4* 6.7 5.7*  --   --   BILITOT 2.5* 1.5* 1.4*  --   --   ALKPHOS 103 82 80  --   --   ALT 56* 39 37  --   --   AST 153* 63* 75*  --   --   GLUCOSE 184* 122* 96 96 101*     Imaging/Diagnostic Tests: Ct Abdomen W Wo Contrast  Result Date: 03/01/2019 CLINICAL DATA:  Left adrenal mass. EXAM: CT ABDOMEN WITHOUT AND WITH CONTRAST TECHNIQUE: Multidetector CT imaging of the abdomen was performed following the standard protocol before and following the bolus administration of intravenous contrast. CONTRAST:  195mL OMNIPAQUE IOHEXOL 300 MG/ML  SOLN COMPARISON:  03/01/2019 FINDINGS: Lower chest: Bibasilar atelectasis with small bilateral pleural effusions. Hepatobiliary: No focal abnormality identified in the liver parenchyma. Gallbladder nondistended. No intrahepatic or extrahepatic biliary dilation. Pancreas: Diffuse peripancreatic edema/fluid identified, mainly involving the body and tail. Near confluent fluid attenuation is seen along the greater  curvature of the stomach and just inferior to the pancreatic tail where there is also evidence of peritoneal enhancement. Spleen: No focal parenchymal abnormality. Adrenals/Urinary Tract: Right adrenal gland unremarkable. 3.4 x 3.2 x 3.1 cm left suprarenal soft tissue mass appears to arise from the left adrenal gland. Average attenuation of the mass is 27 Hounsfield units on precontrast imaging, 135 Hounsfield units on portal venous phase imaging and 55 Hounsfield units on delayed imaging. Absolute washout percentage of the lesion is 74% with a relative washout percentage of 59%. Both calculations are consistent with adrenal adenoma although this level of marked enhancement can also be seen in the setting of pheochromocytoma. Kidneys unremarkable. Stomach/Bowel: Fluid noted along the lateral wall of the stomach and greater curvature. Duodenum is normally positioned as is the ligament of Treitz. No small bowel or colonic dilatation within the visualized abdomen. Vascular/Lymphatic: There is abdominal aortic atherosclerosis without aneurysm. Upper normal lymph nodes are seen in the hepato duodenal ligament. Other: None. Musculoskeletal: No worrisome lytic or sclerotic osseous abnormality. IMPRESSION: 1. 3.4 cm left adrenal nodule has washout characteristics consistent  with adenoma although the high level of enhancement after IV contrast administration can also be seen in the setting of pheochromocytoma. Correlation with urine or blood metanephrine levels suggested. 2. Diffuse edema and fluid in the anterior pararenal space compatible with pancreatitis. No evidence for pancreatic necrosis. Electronically Signed   By: Misty Stanley M.D.   On: 03/01/2019 15:32     [place] Patent, Bernita Raisin, DO 03/02/2019, 8:23 AM PGY-1, [place] Intern pager: [phone], text pages welcome

## 2019-03-02 NOTE — Evaluation (Signed)
Clinical/Bedside Swallow Evaluation Patient Details  Name: Joel Baker MRN: [id number] Date of Birth: 1961/03/27  Today's Date: 03/02/2019 Time: SLP Start Time (ACUTE ONLY): 1600 SLP Stop Time (ACUTE ONLY): 1611 SLP Time Calculation (min) (ACUTE ONLY): 11 min  Past Medical History:  Past Medical History:  Diagnosis Date  . Hypertension    Past Surgical History: History reviewed. No pertinent surgical history. HPI:  Pt is a 58 yo male who presents with SVT and acute pancreatitis. SLP swallow evaluation was ordered due to pt report of SOB during eating. PMH includes: alcohol abuse, HTN, sarcoidosis   Assessment / Plan / Recommendation Clinical Impression  Pt's oropharyngeal swallow appears functional and he has no overt signs of aspiration, even while consuming large, sequential boluses (volume not predetermined, but suspected to be at or near 3 ounces). He has mildly prolonged mastication that he attributes to his lack of dentition, and is therefore likely his baseline. He does not like wearing his dentures at home either, and says that he avoids solid foods. Pt does describe SOB specifically with mastication. He had heavier breathing/louder x2 during mastication, but he did not appear to have any other changes in respirations. Of note, this also occurred while he was talking about becoming SOB during chewing. Education was provided about the importance of taking breaks during eating if feeling SOB, and encouraged pt to select slightly softer foods from the menu to conserve energy (pt had selected a thick hamburger for lunch and said it was tough to chew). He would prefer to have full range of the menu in order to select foods that he would normally be able to chew at home. Would continue regular solids and thin liquids. SLP to sign off - please reorder with any acute changes. SLP Visit Diagnosis: Dysphagia, unspecified (R13.10)    Aspiration Risk  Mild aspiration risk    Diet Recommendation  Regular;Thin liquid   Liquid Administration via: Cup;Straw Medication Administration: Whole meds with liquid Supervision: Patient able to self feed;Intermittent supervision to cue for compensatory strategies Compensations: Slow rate;Small sips/bites Postural Changes: Seated upright at 90 degrees    Other  Recommendations Oral Care Recommendations: Oral care BID   Follow up Recommendations None      Frequency and Duration            Prognosis        Swallow Study   General HPI: Pt is a 58 yo male who presents with SVT and acute pancreatitis. SLP swallow evaluation was ordered due to pt report of SOB during eating. PMH includes: alcohol abuse, HTN, sarcoidosis Type of Study: Bedside Swallow Evaluation Previous Swallow Assessment: none in chart Diet Prior to this Study: Regular;Thin liquids Temperature Spikes Noted: No Respiratory Status: Room air History of Recent Intubation: No Behavior/Cognition: Alert;Cooperative Oral Cavity Assessment: Within Functional Limits Oral Care Completed by SLP: No Oral Cavity - Dentition: Edentulous Vision: Functional for self-feeding Self-Feeding Abilities: Able to feed self Patient Positioning: Upright in bed Baseline Vocal Quality: Normal Volitional Swallow: Able to elicit    Oral/Motor/Sensory Function Overall Oral Motor/Sensory Function: Within functional limits   Ice Chips Ice chips: Not tested   Thin Liquid Thin Liquid: Within functional limits Presentation: Self Fed;Straw    Nectar Thick Nectar Thick Liquid: Not tested   Honey Thick Honey Thick Liquid: Not tested   Puree Puree: Not tested   Solid     Solid: Within functional limits Presentation: Self Fed      Joel Baker P Tomoki Lucken 03/02/2019,4:27 PM  Ivar Drape, M.A. CCC-SLP Acute Herbalist [phone] Office [phone]

## 2019-03-03 DIAGNOSIS — K859 Acute pancreatitis without necrosis or infection, unspecified: Secondary | ICD-10-CM

## 2019-03-03 DIAGNOSIS — F1020 Alcohol dependence, uncomplicated: Secondary | ICD-10-CM

## 2019-03-03 DIAGNOSIS — D72829 Elevated white blood cell count, unspecified: Secondary | ICD-10-CM

## 2019-03-03 DIAGNOSIS — F17200 Nicotine dependence, unspecified, uncomplicated: Secondary | ICD-10-CM

## 2019-03-03 DIAGNOSIS — D72828 Other elevated white blood cell count: Secondary | ICD-10-CM

## 2019-03-03 LAB — BASIC METABOLIC PANEL
Anion gap: 8 (ref 5–15)
BUN: 7 mg/dL (ref 6–20)
CO2: 25 mmol/L (ref 22–32)
Calcium: 9.6 mg/dL (ref 8.9–10.3)
Chloride: 98 mmol/L (ref 98–111)
Creatinine, Ser: 0.89 mg/dL (ref 0.61–1.24)
GFR calc Af Amer: >60 mL/min (ref >60)
GFR calc non Af Amer: >60 mL/min (ref >60)
Glucose, Bld: 101 mg/dL — ABNORMAL HIGH (ref 70–99)
Potassium: 3.9 mmol/L (ref 3.5–5.1)
Sodium: 131 mmol/L — ABNORMAL LOW (ref 135–145)

## 2019-03-03 LAB — CBC WITH DIFFERENTIAL/PLATELET
Abs Immature Granulocytes: 0.82 10*3/uL — ABNORMAL HIGH (ref 0.00–0.07)
Basophils Absolute: 0.1 10*3/uL (ref 0.0–0.1)
Basophils Relative: 0 %
Eosinophils Absolute: 0.1 10*3/uL (ref 0.0–0.5)
Eosinophils Relative: 0 %
HCT: 35.2 % — ABNORMAL LOW (ref 39.0–52.0)
Hemoglobin: 12.2 g/dL — ABNORMAL LOW (ref 13.0–17.0)
Immature Granulocytes: 4 %
Lymphocytes Relative: 5 %
Lymphs Abs: 1.2 10*3/uL (ref 0.7–4.0)
MCH: 34.4 pg — ABNORMAL HIGH (ref 26.0–34.0)
MCHC: 34.7 g/dL (ref 30.0–36.0)
MCV: 99.2 fL (ref 80.0–100.0)
Monocytes Absolute: 1.7 10*3/uL — ABNORMAL HIGH (ref 0.1–1.0)
Monocytes Relative: 7 %
Neutro Abs: 19.9 10*3/uL — ABNORMAL HIGH (ref 1.7–7.7)
Neutrophils Relative %: 84 %
Platelets: 411 10*3/uL — ABNORMAL HIGH (ref 150–400)
RBC: 3.55 MIL/uL — ABNORMAL LOW (ref 4.22–5.81)
RDW: 13.4 % (ref 11.5–15.5)
WBC: 23.7 10*3/uL — ABNORMAL HIGH (ref 4.0–10.5)
nRBC: 0 % (ref 0.0–0.2)

## 2019-03-03 NOTE — Progress Notes (Signed)
Assisted pt with wash up at the sink. Bath basin with soapy water placed in front of him along with wash cloths, body wash, and lotion. Assisted with taking gown off, and asked if he needed help with anything else before leaving to provide privacy per pt's request. When finished, assisted with placing gown and tele monitor back on, cleaned up patients dirty linen, and assisted back into bed. Placed dinner tray in font of pt ([place] sandwich) and placed call light within reach.

## 2019-03-03 NOTE — Progress Notes (Signed)
CIWA score 0.    Pt states he hasn't drank anything since Good Friday because "he just didn't want to", that he just woke up that way.

## 2019-03-03 NOTE — Progress Notes (Addendum)
Family Medicine Teaching Service Daily Progress Note Intern Pager: [phone]  Patient name: Joel Baker Medical record number: [id number] Date of birth: Jun 28, 1961 Age: 58 y.o. Gender: male  Primary Care Provider: Maude Leriche, PA-C Consultants: Cardiology  Code Status: Full   Pt Overview and Major Events to Date:  Admitted to [facility] on 4/16 Developed Fever 4/18 Adrenal CT 4/19  Assessment and Plan: Joel Baker a 58 y.o.malepresenting with SVT and pancreatitis. PMH is significant foralcohol use disorder, hypertension, sarcoidosis.  Fever/Leukocytosis  Patient has been afebrile since 0300 4/19.  Urine Cx showing no growth.  Adrenal CT performed 4/19 to assess for possible abscess, not suggestive of abscess or other source of infection.  Reassured as patient is overall very well-appearing and has now been 24 hours without fever.  WBC continues to increase,  20.8>22.1>23.7.  Etiology remains unclear.  Per GI will need to continue evaluation for leukocytosis. GI has signed off.  -Continue to monitor fever curve -ID consulted given unclear source of leukocytosis, appreciate recommendations  -trend WBC count   SOB  Patient notes SOB only with swallowing food.  SLP with swallow eval on 4/20 showing no f/u needed, recommended regular solids and thin liquids. He denies difficulty swallow, choking, or odynophagia.  Wife noted that he is SOB during phone conversations with her. Will consult SLP for swallow evaluation as would like to make sure patient is not aspirating.  Reassured that he is otherwise not SOB and lungs are clear on exam. Continues to sat well on room air. CXR from 4/20 unchanged.  - SLP recommendations: have signed off   Atrial flutter with 2:1 block: resolved Heart rate appears improved with increase in diltiazem to 240 mg daily.  Heart rate over the last 24 hours 91-104. -cardiology has signed off: outpatient f/u in 2 months -on discharge will refer to Dr. Lovena Le with EP  as Dr. Radford Pax notes that patient could likely benefit from ablation -cont  Eliquis 5mg  BID -Continue diltiazem 240 mg daily -Echo was deferred by cardiology as would not change current management -monitor vitals  Acute pancreatitis Continues to tolerate good p.o. diet.  Continues to deny abdominal pain.  Adrenal CT with evidence of acute pancreatitis including diffuse fluid and edema.  No psuedocyst or necrosis. -Continue regular diet -Pantoprazole 40 mg twice daily -Tylenol for pain consider increasing pain regimen (very mild pain on admission) -GI consulted, appreciate recommendations: have signed off   Adrenal nodule CT on 4/16 showed 3.5 cm mass on left adrenal gland.  Adrenal CT performed yesterday, with suspicion for pheochromocytoma.  Patient did have elevated HR and was in A flutter which could be 2/2 pheochromocytoma.   -Blood metanephrines pending, 3-6 days to result -consider outpatient endo f/u if positive   Hypertension, chronic Had not taken HCTZ in last 80-month as outpatient.  BP this AM 128/90. -Continue to monitor  Alcohol use disorder He reports drinkingthree12 ounce beers and 2 glasses of wine daily. He reports his last drink was on 4/10. He denies ever having withdrawal symptoms in the past. 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 Is not currently taking any medication for this. -outpatient f/u  Hypokalemia: Resolved K 3.9 today.   -Follow-up BMP  Mild Hyponatremia Na 136>131>132>131.  Was 131 on admission.  Unsure if chronic.  Patient continues to take good PO.  Expect to continue to  improve as PO intake continues to improve. - cont to monitor   FEN/GI: Regular Prophylaxis:Lovenox  Disposition: pending ID recommendations   Subjective:  Patient states he fells well. Denies SOB. States his wife was  "overreacting" and he was not SOB yesterday. Denies abdominal pain.   Objective: Temp:  [98.6 F (37 C)-100.4 F (38 C)] 100.4 F (38 C) (04/21 1139) Pulse Rate:  [93-112] 112 (04/21 1139) Resp:  [17-26] 26 (04/21 0622) BP: (110-130)/(74-90) 125/81 (04/21 1139) SpO2:  [98 %-100 %] 99 % (04/21 1139) Weight:  [58 kg] 58 kg (04/21 0335) Physical Exam: General: awake and alert, laying in bed watching television  Cardiovascular: RRR, no MRG, 2+ radial pulses  Respiratory: CTAB, no wheezes, rales, or rhonchi. Speaking full sentences. No increased WOB. No accessory muscle use.  Abdomen: soft, non tender, non distended, bowel sounds normal  Extremities: no edema, non tender   Laboratory: Recent Labs  Lab 03/01/19 0311 03/02/19 0427 03/03/19 0331  WBC 20.8* 22.1* 23.7*  HGB 11.3* 11.6* 12.2*  HCT 31.8* 33.6* 35.2*  PLT 290 343 411*   Recent Labs  Lab 02/26/19 1509 02/27/19 0319 02/28/19 0245 03/01/19 0311 03/02/19 0427 03/03/19 0331  NA 131* 134* 136 131* 132* 131*  K 4.0 4.3 3.1* 3.0* 4.0 3.9  CL 91* 103 101 98 99 98  CO2 24 18* 25 24 25 25   BUN 79* 62* 16 5* 5* 7  CREATININE 4.20* 2.31* 1.02 0.92 0.91 0.89  CALCIUM 10.8* 9.6 8.9 8.7* 9.0 9.6  PROT 8.4* 6.7 5.7*  --   --   --   BILITOT 2.5* 1.5* 1.4*  --   --   --   ALKPHOS 103 82 80  --   --   --   ALT 56* 39 37  --   --   --   AST 153* 63* 75*  --   --   --   GLUCOSE 184* 122* 96 96 101* 101*   Imaging/Diagnostic Tests: Ct Abdomen Pelvis Wo Contrast  Result Date: 02/26/2019 CLINICAL DATA:  58 y/o  M; nausea, vomiting, abdominal pain. EXAM: CT ABDOMEN AND PELVIS WITHOUT CONTRAST TECHNIQUE: Multidetector CT imaging of the abdomen and pelvis was performed following the standard protocol without IV contrast. COMPARISON:  None. FINDINGS: Lower chest: No acute abnormality. Hepatobiliary: No focal liver abnormality is seen. No gallstones, gallbladder wall thickening, or biliary dilatation. Pancreas: Extensive edema  surrounding the pancreas, throughout the retroperitoneum and into the mesentery no main duct dilatation or hypoattenuation of the pancreas. There is a fluid collection which extends along greater curvature of the stomach and the body of the pancreas spanning up to 8 cm (series 3, image 16 and series 6, image 45). Spleen: Normal in size without focal abnormality. Adrenals/Urinary Tract: Left adrenal mass measuring 3.5 cm and 18 HU (series 3, image 22). Normal right adrenal gland. No focal kidney lesion identified. No urinary stone disease or hydronephrosis. Normal bladder. Stomach/Bowel: Stomach appears unremarkable on this noncontrast examination. Appendix appears normal. No evidence of bowel wall thickening, distention, or inflammatory changes. Vascular/Lymphatic: Aortic atherosclerosis. No enlarged abdominal or pelvic lymph nodes. Reproductive: Prostate is unremarkable. Other: No abdominal wall hernia. Musculoskeletal: No fracture is seen. IMPRESSION: 1. Extensive edema surrounding the pancreas, left posterior retroperitoneum, and mesentery. Fluid collection along greater curvature of stomach and body of pancreas. Findings favor acute pancreatitis with acute peripancreatic collection, less likely penetrating gastric ulcer. 2. 3.5 cm indeterminate left adrenal mass. Further characterization with adrenal protocol CT  or MRI is recommended on a nonemergent basis. This recommendation follows ACR consensus guidelines: Management of Incidental Adrenal Masses: A White Paper of the ACR Incidental Findings Committee. J Am Coll Radiol 2017;14:1038-1044. Electronically Signed   By: Kristine Garbe M.D.   On: 02/26/2019 20:31   Dg Chest 2 View  Result Date: 03/02/2019 CLINICAL DATA:  Shortness of breath EXAM: CHEST - 2 VIEW COMPARISON:  02/28/2019 FINDINGS: Is heart is normal size. Small left pleural effusion with left base atelectasis. Right lung clear. No acute bony abnormality. IMPRESSION: Small left effusion  with left base atelectasis. Electronically Signed   By: Rolm Baptise M.D.   On: 03/02/2019 22:14   Dg Chest 2 View  Result Date: 02/28/2019 CLINICAL DATA:  58 year old male with SVT, fever. EXAM: CHEST - 2 VIEW COMPARISON:  CT Abdomen and Pelvis and AP chest radiograph 02/26/2019. FINDINGS: Upright AP and lateral views of the chest. New barium type contrast at the splenic flexure which is nondilated. No pneumoperitoneum is evident. On the lateral view there may be small pleural effusions, and mildly lower lung volumes but otherwise the lungs appear clear. Mediastinal contours remain normal. Visualized tracheal air column is within normal limits. No acute osseous abnormality identified. IMPRESSION: Possible new small pleural effusions, but no other acute cardiopulmonary abnormality. Electronically Signed   By: Genevie Ann M.D.   On: 02/28/2019 17:33   Ct Abdomen W Wo Contrast  Result Date: 03/01/2019 CLINICAL DATA:  Left adrenal mass. EXAM: CT ABDOMEN WITHOUT AND WITH CONTRAST TECHNIQUE: Multidetector CT imaging of the abdomen was performed following the standard protocol before and following the bolus administration of intravenous contrast. CONTRAST:  138mL OMNIPAQUE IOHEXOL 300 MG/ML  SOLN COMPARISON:  03/01/2019 FINDINGS: Lower chest: Bibasilar atelectasis with small bilateral pleural effusions. Hepatobiliary: No focal abnormality identified in the liver parenchyma. Gallbladder nondistended. No intrahepatic or extrahepatic biliary dilation. Pancreas: Diffuse peripancreatic edema/fluid identified, mainly involving the body and tail. Near confluent fluid attenuation is seen along the greater curvature of the stomach and just inferior to the pancreatic tail where there is also evidence of peritoneal enhancement. Spleen: No focal parenchymal abnormality. Adrenals/Urinary Tract: Right adrenal gland unremarkable. 3.4 x 3.2 x 3.1 cm left suprarenal soft tissue mass appears to arise from the left adrenal gland.  Average attenuation of the mass is 27 Hounsfield units on precontrast imaging, 135 Hounsfield units on portal venous phase imaging and 55 Hounsfield units on delayed imaging. Absolute washout percentage of the lesion is 74% with a relative washout percentage of 59%. Both calculations are consistent with adrenal adenoma although this level of marked enhancement can also be seen in the setting of pheochromocytoma. Kidneys unremarkable. Stomach/Bowel: Fluid noted along the lateral wall of the stomach and greater curvature. Duodenum is normally positioned as is the ligament of Treitz. No small bowel or colonic dilatation within the visualized abdomen. Vascular/Lymphatic: There is abdominal aortic atherosclerosis without aneurysm. Upper normal lymph nodes are seen in the hepato duodenal ligament. Other: None. Musculoskeletal: No worrisome lytic or sclerotic osseous abnormality. IMPRESSION: 1. 3.4 cm left adrenal nodule has washout characteristics consistent with adenoma although the high level of enhancement after IV contrast administration can also be seen in the setting of pheochromocytoma. Correlation with urine or blood metanephrine levels suggested. 2. Diffuse edema and fluid in the anterior pararenal space compatible with pancreatitis. No evidence for pancreatic necrosis. Electronically Signed   By: Misty Stanley M.D.   On: 03/01/2019 15:32   Dg Chest Portable 1 View  Result Date: 02/26/2019 CLINICAL DATA:  Shortness of breath.  Elevated heart rate. EXAM: PORTABLE CHEST 1 VIEW COMPARISON:  02/26/2019 acute abdomen series FINDINGS: Hyperinflation. Midline trachea. Normal heart size and mediastinal contours. No pleural effusion or pneumothorax. Possible 8 mm nodular density projecting over the right lung base. Numerous leads and wires project over the chest. IMPRESSION: Hyperinflation, without acute disease. Possible nodular density projecting over the right lung base. Consider PA and lateral radiographs after  removal of all leads and wires. Electronically Signed   By: Abigail Miyamoto M.D.   On: 02/26/2019 18:14   Dg Abd 2 Views  Result Date: 02/28/2019 CLINICAL DATA:  Left and right lower quadrant abdominal pain. EXAM: ABDOMEN - 2 VIEW COMPARISON:  Upper GI 02/27/2019.  CT abdomen and pelvis 02/26/2019. FINDINGS: Barium has migrated and is now present from the distal small bowel to the rectum. No dilated loops of bowel are seen to suggest obstruction. There is mild left-sided colonic diverticulosis. No intraperitoneal free air is identified. No acute osseous abnormality is identified. The visualized lung bases are clear. IMPRESSION: No evidence of bowel obstruction. Electronically Signed   By: Logan Bores M.D.   On: 02/28/2019 08:23   Dg Abd Acute W/chest  Result Date: 02/26/2019 CLINICAL DATA:  Shortness of breath, abdominal pain, diarrhea. EXAM: DG ABDOMEN ACUTE W/ 1V CHEST COMPARISON:  None. FINDINGS: There is no evidence of dilated bowel loops or free intraperitoneal air. No radiopaque calculi or other significant radiographic abnormality is seen. Heart size and mediastinal contours are within normal limits. Both lungs are clear. IMPRESSION: No evidence of bowel obstruction or ileus. No acute cardiopulmonary disease. Electronically Signed   By: Marijo Conception M.D.   On: 02/26/2019 15:41   Dg Duanne Limerick W Double Cm (hd Ba)  Result Date: 02/27/2019 CLINICAL DATA:  Abdominal pain. Rule out pancreatitis or gastric ulcer on CT. EXAM: UPPER GI SERIES WITH KUB TECHNIQUE: After obtaining a scout radiograph a routine upper GI series was performed using thin and high density barium. FLUOROSCOPY TIME:  Fluoroscopy Time:  1 minutes 48 seconds Radiation Exposure Index (if provided by the fluoroscopic device): Number of Acquired Spot Images: 0 COMPARISON:  CT abdomen pelvis 02/26/2019 FINDINGS: Preliminary KUB demonstrates mildly distended colon and small bowel. Esophageal mucosa and motility normal. No esophageal stricture or  mass. Negative for hiatal hernia or reflux Negative for gastric ulcer or mass.  No significant mucosal edema. Barium emptied readily into the small bowel. Negative for duodenal ulcer. Mild dilatation of the proximal small bowel including the duodenum and jejunum. IMPRESSION: 1. Negative for gastric ulcer.  Negative for duodenal ulcer 2. Normal esophagus 3. Mild dilatation of the small bowel raising the possibility of partial small bowel obstruction. Review of prior studies does not reveal definite dilatation of the jejunum on recent CT. Recommend two-view abdomen in AM for follow-up. Electronically Signed   By: Franchot Gallo M.D.   On: 02/27/2019 15:57     Caroline More, DO 03/03/2019, 12:16 PM PGY-2, [facility] Intern pager: [phone], text pages welcome

## 2019-03-03 NOTE — Progress Notes (Signed)
Chicken Noodle soup, crackers and sprite given Ordered patient dinner per request.  Changed diet non-room service appropriate.   Sharyn Lull, RN

## 2019-03-03 NOTE — Progress Notes (Signed)
Pt notified that spouse called about his eye/being blind and requesting to tell the doctor.  Pt states the he doesn't know why she did that because he has a eye dr at [facility] and a followup appt scheduled.  Pt states she wanted to tell us that he needs assistance opening up his food but you guys already do that.

## 2019-03-03 NOTE — Consult Note (Signed)
[facility]       Reason for Consult: leukocytosis    Referring Physician: Arizona Constable, DO  Principal Problem:   Pancreatitis, acute Active Problems:   Narrow complex tachycardia ([facility])   Essential hypertension   SVT (supraventricular tachycardia) (HCC)   Pancreatitis   Atrial flutter with rapid ventricular response (HCC)   Gastric ulcer   AKI (acute kidney injury) (HCC)   Abdominal pain   Fever   Leucocytosis    apixaban  5 mg Oral BID   diltiazem  240 mg Oral Daily   feeding supplement (ENSURE ENLIVE)  237 mL Oral TID BM   folic acid  1 mg Oral Daily   multivitamin with minerals  1 tablet Oral Daily   pantoprazole  40 mg Oral Daily   thiamine  100 mg Oral Daily    Recommendations: 1. Leukocytosis -this is most likely secondary to the patient's known pancreatitis as his white blood cell count has never normalized throughout his hospitalization.  Curiously, I do not see that the patient has had a lipase obtained despite his radiographic imaging and concern for pancreatitis.  His risk factors certainly are consistent with this illness but it might be of value to check lipase and trend with his white blood cell count to see if they elevate simultaneously.  The recent rise in the patient's white blood cell count does correlate however with recent efforts to reinitiate enteral feedings, the phenomenon commonly seen in patients with active pancreatitis.  Without the absence of fever I would limit his work-up as he has had a CT scan with contrast to effectively rule out pancreatic necrosis as a complication or the development of pseudocysts.  Will check CBC with differential regularly follow trend however would not be surprised if the patient does trend and peak around 30,000 given the severity of his illness.  May consider a tagged white blood cell scan to rule out other indolent possibilities and would especially consider this if the patient  were to develop recurrent fever.  2. Fever- patient is been afebrile now for 72 hours I agree with holding antibiotics as there is no active source of infection is yet been identified.  Certainly, the patient does have risk to develop necrotizing pancreatitis or pseudocyst later, but currently clinical evidence for this is lacking.  Would repeat the patient's blood cultures x2 whenever he is febrile greater than 100.5.  He appears quite clinically stable at the time and not at risk currently for the emergence of sepsis.  3.  Pancreatitis- this would most likely be due to the patient's significant alcohol history particularly in recent days and weeks prior to his admission.  Check a lipase now to establish a baseline and if elevated trend with his white blood cell count to assess for correlation, recognizing that recovery of the patient's lipase to a normal level does not rule out active pancreatitis as the cause of his elevated white blood cell count and even fever.  Would have a low threshold for involving GI again should the patient failed to improve particularly with efforts to reestablish enteral feedings.  Assessment: The patient is a 58 year old African-American male alcoholic admitted with abdominal pain, acute renal failure, leukocytosis, and acute pancreatitis.  Antibiotics: None at present  HPI: Joel Baker is a 58 y.o. male with significant alcohol history who was admitted on February 26, 2019 with leukocytosis and subsequently found to have acute renal failure and pancreatitis.  The patient does  admit that his daily habit is drinking approximately 2 12 ounce cans of beers as well as 2 full glasses of wine daily.  He is continued this practice for the past 15 to 20 years.  While self quarantining at home due to the recent COVID-19 outbreak, he does admit that he was drinking more than his usual over the past 3 weeks.  CT imaging did confirm pancreatitis early in his admission.  His diet was held  and he was administered supportive care with IV fluids.  His acute renal failure resolved and the patient has been evaluated by the gastroenterology service.  Initially, there was some concern for a possible gastric perforated ulcer versus a possible partial small bowel obstruction.  Repeat imaging with contrast was performed that showed no evidence of pseudocyst or other complicating features of his known pancreatitis.  A 3.4 cm left adrenal nodule favoring adenoma or less likely pheochromocytoma was noted.  Three days ago he was febrile, but he has had no further temperature since this time.  His chest x-ray shows no abnormalities and the patient has no other complaints other than waxing and waning diarrhea that is now transition into constipation.  He has continued to tolerate a diet that is now been advanced to full/regular per his nurse for the last 2 meals.  Of note, the patient's white blood cell count nadir was 15,400 on April 17.  He has since increased to his current level of 23,700 today.  Blood cultures obtained on March 02, 2019 and prior urine culture remain unrevealing at this time. Fever curve, WBCs trends, cxs, and imagine all independently reviewed  Review of Systems: Positive diffuse abdominal pain for 3 days prior to admission, positive bilious emesis x2-day prior to admission.  Patient admits to watery diarrhea the first 3 days of his hospitalization which is now transition to constipation.  He denies any shortness of breath, headaches, or cough.  Positive fever 3 days ago none since. All other systems reviewed and are negative    Past Medical History:  Diagnosis Date   Hypertension     Social History   Tobacco Use   Smoking status: Current Every Day Smoker   Smokeless tobacco: Never Used  Substance Use Topics   Alcohol use: Yes    Comment: daily   Drug use: Yes    Types: Marijuana    Comment: every other day    History reviewed. No pertinent family history.  No  Known Allergies  Physical Exam:  Vitals:   03/03/19 0622 03/03/19 1139  BP: 128/90 125/81  Pulse: 93 (!) 112  Resp: (!) 26   Temp: 98.6 F (37 C) (!) 100.4 F (38 C)  SpO2:  99%  Physical Exam Gen: pleasant, NAD, A&Ox 3 Head: NCAT, no temporal wasting evident EENT: PERRL, EOMI, MMM, adequate dentition Neck: supple, no JVD CV: NRRR, no murmurs evident Pulm: CTA bilaterally, no wheeze or retractions Abd: soft, mild TTP at LUQ, OTW NT, +BS Extrems:  No LE edema, 2+ pulses Skin: no rashes, adequate skin turgor Neuro: CN II-XII grossly intact, no focal neurologic deficits appreciated, gait was not assessed, A&Ox 3   Lab Results  Component Value Date   WBC 23.7 (H) 03/03/2019   HGB 12.2 (L) 03/03/2019   HCT 35.2 (L) 03/03/2019   MCV 99.2 03/03/2019   PLT 411 (H) 03/03/2019    Lab Results  Component Value Date   CREATININE 0.89 03/03/2019   BUN 7 03/03/2019   NA 131 (  L) 03/03/2019   K 3.9 03/03/2019   CL 98 03/03/2019   CO2 25 03/03/2019    Lab Results  Component Value Date   ALT 37 02/28/2019   AST 75 (H) 02/28/2019   ALKPHOS 80 02/28/2019     Microbiology: Recent Results (from the past 240 hour(s))  Urine Culture     Status: None   Collection Time: 02/28/19  8:57 PM  Result Value Ref Range Status   Specimen Description URINE, RANDOM  Final   Special Requests NONE  Final   Culture   Final    NO GROWTH Performed at [facility], [place] [address], [place], [place] [address]    Report Status 03/02/2019 FINAL  Final  Culture, blood (routine x 2)     Status: None (Preliminary result)   Collection Time: 03/02/19  3:25 PM  Result Value Ref Range Status   Specimen Description BLOOD LEFT ANTECUBITAL  Final   Special Requests   Final    BOTTLES DRAWN AEROBIC ONLY Blood Culture adequate volume   Culture   Final    NO GROWTH < 24 HOURS Performed at [facility], [place] [address] [place], [place] [address]    Report Status PENDING  Incomplete  Culture,  blood (routine x 2)     Status: None (Preliminary result)   Collection Time: 03/02/19  3:30 PM  Result Value Ref Range Status   Specimen Description BLOOD LEFT ANTECUBITAL  Final   Special Requests   Final    BOTTLES DRAWN AEROBIC ONLY Blood Culture adequate volume   Culture   Final    NO GROWTH < 24 HOURS Performed at [facility], [place] [address], [place],  [address]    Report Status PENDING  Incomplete    Janine Ores, MD [email or web address] 03/03/2019, 12:27 PM

## 2019-03-04 LAB — CBC WITH DIFFERENTIAL/PLATELET
Abs Immature Granulocytes: 0.69 10*3/uL — ABNORMAL HIGH (ref 0.00–0.07)
Basophils Absolute: 0.1 10*3/uL (ref 0.0–0.1)
Basophils Relative: 0 %
Eosinophils Absolute: 0.1 10*3/uL (ref 0.0–0.5)
Eosinophils Relative: 0 %
HCT: 33.1 % — ABNORMAL LOW (ref 39.0–52.0)
Hemoglobin: 11.7 g/dL — ABNORMAL LOW (ref 13.0–17.0)
Immature Granulocytes: 3 %
Lymphocytes Relative: 6 %
Lymphs Abs: 1.5 10*3/uL (ref 0.7–4.0)
MCH: 35.5 pg — ABNORMAL HIGH (ref 26.0–34.0)
MCHC: 35.3 g/dL (ref 30.0–36.0)
MCV: 100.3 fL — ABNORMAL HIGH (ref 80.0–100.0)
Monocytes Absolute: 1.7 10*3/uL — ABNORMAL HIGH (ref 0.1–1.0)
Monocytes Relative: 7 %
Neutro Abs: 19.2 10*3/uL — ABNORMAL HIGH (ref 1.7–7.7)
Neutrophils Relative %: 84 %
Platelets: 421 10*3/uL — ABNORMAL HIGH (ref 150–400)
RBC: 3.3 MIL/uL — ABNORMAL LOW (ref 4.22–5.81)
RDW: 13.6 % (ref 11.5–15.5)
WBC: 23.2 10*3/uL — ABNORMAL HIGH (ref 4.0–10.5)
nRBC: 0 % (ref 0.0–0.2)

## 2019-03-04 LAB — METANEPHRINES, PLASMA
Metanephrine, Free: 25 pg/mL (ref 0–62)
Normetanephrine, Free: 49 pg/mL (ref 0–145)

## 2019-03-04 LAB — COMPREHENSIVE METABOLIC PANEL
ALT: 55 U/L — ABNORMAL HIGH (ref 0–44)
AST: 78 U/L — ABNORMAL HIGH (ref 15–41)
Albumin: 2.1 g/dL — ABNORMAL LOW (ref 3.5–5.0)
Alkaline Phosphatase: 112 U/L (ref 38–126)
Anion gap: 8 (ref 5–15)
BUN: 7 mg/dL (ref 6–20)
CO2: 26 mmol/L (ref 22–32)
Calcium: 9.7 mg/dL (ref 8.9–10.3)
Chloride: 99 mmol/L (ref 98–111)
Creatinine, Ser: 0.86 mg/dL (ref 0.61–1.24)
GFR calc Af Amer: >60 mL/min (ref >60)
GFR calc non Af Amer: >60 mL/min (ref >60)
Glucose, Bld: 98 mg/dL (ref 70–99)
Potassium: 3.7 mmol/L (ref 3.5–5.1)
Sodium: 133 mmol/L — ABNORMAL LOW (ref 135–145)
Total Bilirubin: 0.8 mg/dL (ref 0.3–1.2)
Total Protein: 6.1 g/dL — ABNORMAL LOW (ref 6.5–8.1)

## 2019-03-04 LAB — LIPASE, BLOOD: Lipase: 28 U/L (ref 11–51)

## 2019-03-04 MED ORDER — DILTIAZEM HCL ER COATED BEADS 180 MG PO CP24
300.0000 mg | ORAL_CAPSULE | Freq: Every day | ORAL | Status: DC
  Administered 2019-03-05 – 2019-03-06 (×2): 300 mg via ORAL
  Filled 2019-03-04 (×2): qty 1

## 2019-03-04 NOTE — Progress Notes (Signed)
[facility] for Infectious Disease   Reason for visit: Follow up on leukocytosis  Antibiotics: None at present  Interval History: contacted by FM team this AM re: low grade fever o/n; continues to tolerate diet w/o event; tagged WBC postponed until tomorrow WBC trend, fever curve, ABX use, imaging, and lipase all independently reviewed   Physical Exam: Vitals:   03/04/19 0831 03/04/19 1310  BP: 127/90 121/74  Pulse: (!) 111 (!) 107  Resp: 20 18  Temp: (!) 100.5 F (38.1 C) 100.1 F (37.8 C)  SpO2: 100% 98%  Gen: pleasant, NAD, A&Ox 3 Head: NCAT, no temporal wasting evident EENT: PERRL, EOMI, MMM, adequate dentition Neck: supple, no JVD CV: NRRR, no murmurs evident Pulm: CTA bilaterally, no wheeze or retractions Abd: soft, mild TTP at LUQ, OTW NT, +BS Extrems:  No LE edema, 2+ pulses Skin: no rashes, adequate skin turgor Neuro: CN II-XII grossly intact, no focal neurologic deficits appreciated, gait was not assessed, A&Ox 3  Review of Systems: Positive diffuse abdominal pain for 3 days prior to admission, positive bilious emesis x2-day prior to admission.  Patient admits to watery diarrhea the first 3 days of his hospitalization which is now transition to constipation.  He denies any shortness of breath, headaches, or cough.  Positive fever 3 days ago none since. All other systems reviewed and are negative  Lab Results  Component Value Date   WBC 23.2 (H) 03/04/2019   HGB 11.7 (L) 03/04/2019   HCT 33.1 (L) 03/04/2019   MCV 100.3 (H) 03/04/2019   PLT 421 (H) 03/04/2019    Lab Results  Component Value Date   CREATININE 0.86 03/04/2019   BUN 7 03/04/2019   NA 133 (L) 03/04/2019   K 3.7 03/04/2019   CL 99 03/04/2019   CO2 26 03/04/2019    Lab Results  Component Value Date   ALT 55 (H) 03/04/2019   AST 78 (H) 03/04/2019   ALKPHOS 112 03/04/2019     Microbiology: Recent Results (from the past 240 hour(s))  Urine Culture     Status: None   Collection  Time: 02/28/19  8:57 PM  Result Value Ref Range Status   Specimen Description URINE, RANDOM  Final   Special Requests NONE  Final   Culture   Final    NO GROWTH Performed at [facility], [place] [address] [place], [place] [address]    Report Status 03/02/2019 FINAL  Final  Culture, blood (routine x 2)     Status: None (Preliminary result)   Collection Time: 03/02/19  3:25 PM  Result Value Ref Range Status   Specimen Description BLOOD LEFT ANTECUBITAL  Final   Special Requests   Final    BOTTLES DRAWN AEROBIC ONLY Blood Culture adequate volume   Culture   Final    NO GROWTH 2 DAYS Performed at [facility], [place] [address], [place], [place] [address]    Report Status PENDING  Incomplete  Culture, blood (routine x 2)     Status: None (Preliminary result)   Collection Time: 03/02/19  3:30 PM  Result Value Ref Range Status   Specimen Description BLOOD LEFT ANTECUBITAL  Final   Special Requests   Final    BOTTLES DRAWN AEROBIC ONLY Blood Culture adequate volume   Culture   Final    NO GROWTH 2 DAYS Performed at [facility], [place] [address], [place], [place] [address]    Report Status PENDING  Incomplete  Impression/Plan: The patient is a 58 year old African-American male alcoholic admitted with abdominal pain, acute renal failure, leukocytosis, and acute pancreatitis.  1. Leukocytosis -this is most likely secondary to the patient's known pancreatitis as his white blood cell count has never normalized throughout his hospitalization. Today's lipase was WNL.  The recent rise in the patient's white blood cell count does correlate however with recent efforts to reinitiate enteral feedings, a phenomenon commonly seen in patients with active pancreatitis.  He has had a CT scan with contrast to effectively rule out pancreatic necrosis as a complication or the development of pseudocysts.  Will check CBC with differential regularly to follow trend however would not be surprised  if the patient does trend and peak around 30,000 given the severity of his pancreatitis.  Given his low grade fever, would check a tagged white blood cell scan to rule out other indolent possibilities.  2. Fever- patient had been afebrile for 72 hours prior to slight fever of 100.5 o/n. I agree with holding antibiotics as there is no active source of infection that has yet been identified.  Certainly, the patient does have risk to develop necrotizing pancreatitis or pseudocyst later, but currently clinical evidence for this is lacking.  Would repeat the patient's blood cultures x2 whenever he is febrile greater than 100.5.  Check a tagged WBC scan as noted above. He appears quite clinically stable at the time and not at risk currently for the emergence of sepsis.  3.  Pancreatitis- this would most likely be due to the patient's significant alcohol history particularly in recent days and weeks prior to his admission.  Lipase does not appear to have been tested serologically on his initial presentation and is now WNL, recognizing that recovery of the patient's lipase to a normal level does not rule out active pancreatitis as the cause of his elevated white blood cell count and even fever.  Would have a low threshold for involving GI again should the patient fail to improve, particularly with efforts to reestablish enteral feedings.

## 2019-03-04 NOTE — Progress Notes (Signed)
When went to room for PM meds, pt stated he is not sure if he had supper yet. I told him yes he did, and he has been sleeping. Offered [place] sandwich, pt said he will take it. Given with strawberry ensure. Will continue to monitor.

## 2019-03-04 NOTE — Progress Notes (Addendum)
Family Medicine Teaching Service Daily Progress Note Intern Pager: [phone]  Patient name: Joel Baker Medical record number: [id number] Date of birth: January 08, 1961 Age: 58 y.o. Gender: male  Primary Care Provider: Maude Leriche, PA-C Consultants: Cardiology  Code Status: Full   Pt Overview and Major Events to Date:  Admitted to [facility] on 4/16 Developed Fever 4/18 Adrenal CT 4/19  Assessment and Plan: Joel Baker a 58 y.o.malepresenting with SVT and pancreatitis. PMH is significant foralcohol use disorder, hypertension, sarcoidosis.  Fever/Leukocytosis  Patient with temp 100.4 at 1100 yesterday, and now with 100.5 at 0800.  Patient continues to feel asymptomatic.  Spoke with Dr. Prince Rome, ID, who stated that since patient is febrile again, would recommend repeat blood cultures, lipase, and tagged WBC.  WBC 23.7>23.2.  Dr. Prince Rome did note that patient's leukocytosis could be 2/2 refeeding, although now that patient is febrile again, would warrant these labs. Initial blood Cx NG >24 hrs. -Continue to monitor fever curve -ID consulted given unclear source of leukocytosis, appreciate recommendations  -repeat blood cx -lipase -tagged WBC -trend WBC count   SOB: resolved per patient Had some SOB with eating, swallow eval unremarkable.  Repeat CXR 4/20 negative. - SLP recommendations: have signed off   Atrial flutter with 2:1 block: resolved Heart rate appears improved with increase in diltiazem to 240 mg daily.  Had one HR yesterday at 1400 that was 142.  Otherwise, 93-112. -cardiology has signed off: outpatient f/u in 2 months -on discharge will refer to Dr. Lovena Le with EP as Dr. Radford Pax notes that patient could likely benefit from ablation -cont  Eliquis 5mg  BID -Increase diltiazem to 300 mg daily -Echo was deferred by cardiology as would not change current management -monitor vitals  Acute pancreatitis Continues to tolerate good p.o. diet.  Continues to deny abdominal pain.   Adrenal CT with evidence of acute pancreatitis including diffuse fluid and edema.  No psuedocyst or necrosis. -Continue regular diet -Pantoprazole 40 mg twice daily -Tylenol for pain consider increasing pain regimen (very mild pain on admission) -GI consulted, appreciate recommendations: have signed off   Adrenal nodule CT on 4/16 showed 3.5 cm mass on left adrenal gland.  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  Patient did have elevated HR and was in A flutter which could be 2/2 pheochromocytoma.   -Blood metanephrines pending, 3-6 days to result -consider outpatient endo f/u if positive   Hypertension, chronic Had not taken HCTZ in last 36-month as outpatient.  BP this AM 127/90. -Continue to monitor  Alcohol use disorder He reports drinkingthree12 ounce beers and 2 glasses of wine daily. He reports his last drink was on 4/10. He denies ever having withdrawal symptoms in the past. 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 Is not currently taking any medication for this. -outpatient f/u  Mild Hyponatremia Na 136>131>132>131>133.  Was 131 on admission.  Unsure if chronic, but suspect it could be in setting of alcohol use.  Patient continues to take good PO.  Expect to continue to improve as PO intake continues to improve. - cont to monitor   FEN/GI: Regular Prophylaxis:Lovenox  Disposition: pending work up today and ID recs   Subjective:  Patient continues to feel well this AM.  Has no complaints.  Objective: Temp:  [98.1 F (36.7 C)-100.4 F (38 C)] 98.6 F (37 C) (04/22 0540) Pulse Rate:  [  104-112] 106 (04/22 0540) Resp:  [17-20] 19 (04/22 0540) BP: (120-129)/(79-88) 129/88 (04/22 0540) SpO2:  [96 %-100 %] 100 % (04/22 0540) Weight:  [57.8 kg] 57.8 kg (04/22 0540)  Physical Exam: General: 58 y.o. male  in NAD Cardio: RRR no m/r/g Lungs: CTAB, no wheezing, no rhonchi, no crackles, no IWOB Abdomen: Soft, non-tender to palpation, positive bowel sounds Skin: warm and dry Extremities: No edema  Laboratory: Recent Labs  Lab 03/02/19 0427 03/03/19 0331 03/04/19 0331  WBC 22.1* 23.7* 23.2*  HGB 11.6* 12.2* 11.7*  HCT 33.6* 35.2* 33.1*  PLT 343 411* 421*   Recent Labs  Lab 02/27/19 0319 02/28/19 0245  03/02/19 0427 03/03/19 0331 03/04/19 0331  NA 134* 136   < > 132* 131* 133*  K 4.3 3.1*   < > 4.0 3.9 3.7  CL 103 101   < > 99 98 99  CO2 18* 25   < > 25 25 26   BUN 62* 16   < > 5* 7 7  CREATININE 2.31* 1.02   < > 0.91 0.89 0.86  CALCIUM 9.6 8.9   < > 9.0 9.6 9.7  PROT 6.7 5.7*  --   --   --  6.1*  BILITOT 1.5* 1.4*  --   --   --  0.8  ALKPHOS 82 80  --   --   --  112  ALT 39 37  --   --   --  55*  AST 63* 75*  --   --   --  78*  GLUCOSE 122* 96   < > 101* 101* 98   < > = values in this interval not displayed.   Imaging/Diagnostic Tests: No results found.   [place], DO 03/04/2019, 7:51 AM PGY-1, [place] Intern pager: [phone], text pages welcome

## 2019-03-05 ENCOUNTER — Inpatient Hospital Stay (HOSPITAL_COMMUNITY): Payer: Self-pay

## 2019-03-05 LAB — BASIC METABOLIC PANEL
Anion gap: 9 (ref 5–15)
BUN: 5 mg/dL — ABNORMAL LOW (ref 6–20)
CO2: 24 mmol/L (ref 22–32)
Calcium: 9.7 mg/dL (ref 8.9–10.3)
Chloride: 100 mmol/L (ref 98–111)
Creatinine, Ser: 0.77 mg/dL (ref 0.61–1.24)
GFR calc Af Amer: >60 mL/min (ref >60)
GFR calc non Af Amer: >60 mL/min (ref >60)
Glucose, Bld: 102 mg/dL — ABNORMAL HIGH (ref 70–99)
Potassium: 3.8 mmol/L (ref 3.5–5.1)
Sodium: 133 mmol/L — ABNORMAL LOW (ref 135–145)

## 2019-03-05 LAB — CBC WITH DIFFERENTIAL/PLATELET
Abs Immature Granulocytes: 0.41 10*3/uL — ABNORMAL HIGH (ref 0.00–0.07)
Basophils Absolute: 0.1 10*3/uL (ref 0.0–0.1)
Basophils Relative: 0 %
Eosinophils Absolute: 0 10*3/uL (ref 0.0–0.5)
Eosinophils Relative: 0 %
HCT: 32.3 % — ABNORMAL LOW (ref 39.0–52.0)
Hemoglobin: 11.3 g/dL — ABNORMAL LOW (ref 13.0–17.0)
Immature Granulocytes: 2 %
Lymphocytes Relative: 8 %
Lymphs Abs: 1.5 10*3/uL (ref 0.7–4.0)
MCH: 35.1 pg — ABNORMAL HIGH (ref 26.0–34.0)
MCHC: 35 g/dL (ref 30.0–36.0)
MCV: 100.3 fL — ABNORMAL HIGH (ref 80.0–100.0)
Monocytes Absolute: 1.5 10*3/uL — ABNORMAL HIGH (ref 0.1–1.0)
Monocytes Relative: 8 %
Neutro Abs: 15.6 10*3/uL — ABNORMAL HIGH (ref 1.7–7.7)
Neutrophils Relative %: 82 %
Platelets: 442 10*3/uL — ABNORMAL HIGH (ref 150–400)
RBC: 3.22 MIL/uL — ABNORMAL LOW (ref 4.22–5.81)
RDW: 13.6 % (ref 11.5–15.5)
WBC: 19.1 10*3/uL — ABNORMAL HIGH (ref 4.0–10.5)
nRBC: 0 % (ref 0.0–0.2)

## 2019-03-05 MED ORDER — POLYETHYLENE GLYCOL 3350 17 G PO PACK
17.0000 g | PACK | Freq: Once | ORAL | Status: AC
  Administered 2019-03-05: 17 g via ORAL
  Filled 2019-03-05: qty 1

## 2019-03-05 MED ORDER — ENSURE ENLIVE PO LIQD
237.0000 mL | Freq: Two times a day (BID) | ORAL | Status: DC
  Administered 2019-03-06: 237 mL via ORAL

## 2019-03-05 MED ORDER — TECHNETIUM TC 99M EXAMETAZIME IV KIT
10.0000 | PACK | Freq: Once | INTRAVENOUS | Status: AC | PRN
  Administered 2019-03-05: 10 via INTRAVENOUS

## 2019-03-05 NOTE — Progress Notes (Addendum)
Family Medicine Teaching Service Daily Progress Note Intern Pager: [phone]  Patient name: Joel Baker Medical record number: [id number] Date of birth: [birth year] Age: 58 y.o. Gender: male  Primary Care Provider: Maude Leriche, PA-C Consultants: Cardiology  Code Status: Full   Pt Overview and Major Events to Date:  Admitted to [facility] on 4/16 Developed Fever 4/18 Adrenal CT 4/19 Tagged WBC 4/23  Assessment and Plan: Joel Baker a 58 y.o.malepresenting with atrial flutter and pancreatitis, now with recurrent fever and leukocytosis. PMH is significant foralcohol use disorder, hypertension, sarcoidosis.  Fever/Leukocytosis: Improving  WBC 23.3>19.1, ANC 19.2>15.6.  Planning for Tagged WBC today per ID recommendations given that patient febrile yesterday.  Patient afebrile x 24 hrs, since 0831.  Continues to remain asymptomatic.  Lipase repeat yesterday remains WNL.  Dr. Prince Rome did note that leukocytosis could be 2/2 refeeding in the setting of acute pancreatitis, but warrants further investigation with tagged WBC to look for hidden infection.  Blood Cx drawn 4/20 at 15:25, NG at 2 days.  Repeat Blood Cx drawn 4/22 at 0949, pending. -Continue to monitor fever curve -ID consulted given unclear source of leukocytosis, appreciate recommendations  -tagged WBC today -trend WBC count   SOB: resolved per patient Had some SOB with eating, swallow eval unremarkable.  Repeat CXR 4/20 negative. - SLP recommendations: have signed off   Atrial flutter with 2:1 block: resolved HR 93-111 in last 24 hrs.  Patient to received increase dose of dilt today.   -cardiology has signed off: outpatient f/u in 2 months -on discharge will refer to Dr. Lovena Le with EP as Dr. Radford Pax notes that patient could likely benefit from ablation -cont  Eliquis 5mg  BID -cont diltiazem 300 mg daily -Echo was deferred by cardiology as would not change current management, will need as outpatient -monitor HR   Acute  pancreatitis Continues to tolerate good p.o. diet.  Continues to deny abdominal pain.  Adrenal CT with evidence of acute pancreatitis including diffuse fluid and edema.  No psuedocyst or necrosis. -Continue regular diet -Pantoprazole 40 mg twice daily -Tylenol for pain consider increasing pain regimen (very mild pain on admission) -GI consulted, appreciate recommendations: have signed off  - Hepatitis panel  Adrenal nodule CT on 4/16 showed 3.5 cm mass on left adrenal gland.  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  Plasma metanephrines WNL. -no follow up needed - Endo referral as outpatient  Hypertension, chronic Had not taken HCTZ in last 64-month as outpatient.  BP 124/78. -Continue to monitor  Alcohol use disorder He reports drinkingthree12 ounce beers and 2 glasses of wine daily. He reports his last drink was on 4/10. He denies ever having withdrawal symptoms in the past. 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. Is not currently taking any medication for this. -outpatient f/u  Mild Hyponatremia: Stable Na 133>133.  Was 131 on admission.  Unsure if chronic, but suspect it could be in setting of alcohol use.  Patient continues to take good PO.  Expect to continue to improve as PO intake continues to improve. - cont to monitor  Constipation Patient has not had BM in 2 days per his report.  Stating that he feels like he needs to go.  Agreed to 1 dose of MiraLAX, but did not want further intervention. - MiraLAX once    FEN/GI: Regular Prophylaxis:Lovenox  Disposition: pending Tagged  WBC today  Subjective:  Patient complaining of no BM in last two days.  Was wearing of taking something for this, but agreed to one dose of Miralax. Otherwise, no complaints.  Objective: Temp:  [99.6 F (37.6 C)-100.5 F (38.1  C)] 99.7 F (37.6 C) (04/22 2329) Pulse Rate:  [93-111] 98 (04/23 0415) Resp:  [16-20] 16 (04/23 0415) BP: (113-127)/(74-90) 113/85 (04/23 0415) SpO2:  [98 %-100 %] 98 % (04/23 0415) Weight:  [55.6 kg] 55.6 kg (04/23 0415)  Physical Exam: General: 58 y.o. male in NAD Cardio: RRR no m/r/g Lungs: CTAB, no wheezing, no rhonchi, no crackles, no increased work of breathing Abdomen: Soft, non-tender to palpation, minimally distended on exam, positive bowel sounds Skin: warm and dry Extremities: No edema   Laboratory: Recent Labs  Lab 03/03/19 0331 03/04/19 0331 03/05/19 0321  WBC 23.7* 23.2* 19.1*  HGB 12.2* 11.7* 11.3*  HCT 35.2* 33.1* 32.3*  PLT 411* 421* 442*   Recent Labs  Lab 02/27/19 0319 02/28/19 0245  03/03/19 0331 03/04/19 0331 03/05/19 0321  NA 134* 136   < > 131* 133* 133*  K 4.3 3.1*   < > 3.9 3.7 3.8  CL 103 101   < > 98 99 100  CO2 18* 25   < > 25 26 24   BUN 62* 16   < > 7 7 5*  CREATININE 2.31* 1.02   < > 0.89 0.86 0.77  CALCIUM 9.6 8.9   < > 9.6 9.7 9.7  PROT 6.7 5.7*  --   --  6.1*  --   BILITOT 1.5* 1.4*  --   --  0.8  --   ALKPHOS 82 80  --   --  112  --   ALT 39 37  --   --  55*  --   AST 63* 75*  --   --  78*  --   GLUCOSE 122* 96   < > 101* 98 102*   < > = values in this interval not displayed.   Imaging/Diagnostic Tests: No results found.   [place], DO 03/05/2019, 7:28 AM PGY-1, [place] Intern pager: [phone], text pages welcome

## 2019-03-05 NOTE — Progress Notes (Signed)
Nutrition Follow-up  DOCUMENTATION CODES:   Not applicable  INTERVENTION:    Decrease Ensure Enlive po BID, each supplement provides 350 kcal and 20 grams of protein  Continue MVI with minerals  NUTRITION DIAGNOSIS:   Increased nutrient needs related to acute illness as evidenced by estimated needs.  Ongoing  GOAL:   Patient will meet greater than or equal to 90% of their needs  Progressing  MONITOR:   PO intake, Supplement acceptance, Diet advancement, Skin, Weight trends, Labs, I & O's  REASON FOR ASSESSMENT:   Consult Assessment of nutrition requirement/status  ASSESSMENT:   Patient with PMH significant for alcohol abuse, HTN, and sarcoidosis. Presents this admission with SVT and acute pancreatitis.    RD working remotely.  Spoke with pt via phone. Reports his appetite fluctuates but overall is good. Meal completions charted as 25-100% for his last eight meals. Drinks one Ensure a day due to feeling of early satiety with liquids. Encouraged pt to supplement Ensure when appetite is poor (<25% meal completion).   Pt complains of constipation (day 2 without BM). Miralax given this am. Will monitor for results.   Weight noted to decrease from 58.1 kg on 4/17 to 55.6 kg today.  I/O: +32 ml since admit UOP: 1,050 ml x 24 hrs   Medications:folic acid, MVI with minerals, thiamine Labs: Na 133 (L)   Diet Order:   Diet Order            Diet Heart Room service appropriate? No; Fluid consistency: Thin  Diet effective now              EDUCATION NEEDS:   Education needs have been addressed  Skin:  Skin Assessment: Reviewed RN Assessment  Last BM:  4/20  Height:   Ht Readings from Last 1 Encounters:  03/02/19 5\' 9"  (1.753 m)    Weight:   Wt Readings from Last 1 Encounters:  03/05/19 55.6 kg    Ideal Body Weight:  72.7 kg  BMI:  Body mass index is 18.1 kg/m.  Estimated Nutritional Needs:   Kcal:  1800-2000 kcal  Protein:  90-115  grams  Fluid:  >/= 1.8 L/day   Mariana Single RD, LDN Clinical Nutrition Pager # - [phone]

## 2019-03-06 DIAGNOSIS — E278 Other specified disorders of adrenal gland: Secondary | ICD-10-CM

## 2019-03-06 DIAGNOSIS — D72825 Bandemia: Secondary | ICD-10-CM

## 2019-03-06 DIAGNOSIS — Z7901 Long term (current) use of anticoagulants: Secondary | ICD-10-CM

## 2019-03-06 LAB — CBC
HCT: 31.9 % — ABNORMAL LOW (ref 39.0–52.0)
Hemoglobin: 11 g/dL — ABNORMAL LOW (ref 13.0–17.0)
MCH: 35.3 pg — ABNORMAL HIGH (ref 26.0–34.0)
MCHC: 34.5 g/dL (ref 30.0–36.0)
MCV: 102.2 fL — ABNORMAL HIGH (ref 80.0–100.0)
Platelets: 491 10*3/uL — ABNORMAL HIGH (ref 150–400)
RBC: 3.12 MIL/uL — ABNORMAL LOW (ref 4.22–5.81)
RDW: 13.8 % (ref 11.5–15.5)
WBC: 16.2 10*3/uL — ABNORMAL HIGH (ref 4.0–10.5)
nRBC: 0 % (ref 0.0–0.2)

## 2019-03-06 LAB — BASIC METABOLIC PANEL
Anion gap: 7 (ref 5–15)
BUN: <5 mg/dL — ABNORMAL LOW (ref 6–20)
CO2: 25 mmol/L (ref 22–32)
Calcium: 10 mg/dL (ref 8.9–10.3)
Chloride: 102 mmol/L (ref 98–111)
Creatinine, Ser: 0.75 mg/dL (ref 0.61–1.24)
GFR calc Af Amer: >60 mL/min (ref >60)
GFR calc non Af Amer: >60 mL/min (ref >60)
Glucose, Bld: 98 mg/dL (ref 70–99)
Potassium: 4.1 mmol/L (ref 3.5–5.1)
Sodium: 134 mmol/L — ABNORMAL LOW (ref 135–145)

## 2019-03-06 LAB — HEPATITIS PANEL, ACUTE
HCV Ab: <0.1 s/co ratio (ref 0.0–0.9)
Hep A IgM: NEGATIVE
Hep B C IgM: NEGATIVE
Hepatitis B Surface Ag: NEGATIVE

## 2019-03-06 MED ORDER — THIAMINE HCL 100 MG PO TABS: 100.0000 mg | ORAL_TABLET | Freq: Every day | ORAL | 1 refills | Status: DC

## 2019-03-06 MED ORDER — SENNOSIDES-DOCUSATE SODIUM 8.6-50 MG PO TABS: 1.0000 | ORAL_TABLET | ORAL | Status: DC | PRN

## 2019-03-06 MED ORDER — POLYETHYLENE GLYCOL 3350 17 G PO PACK
17.0000 g | PACK | Freq: Every day | ORAL | Status: DC
  Administered 2019-03-06: 17 g via ORAL
  Filled 2019-03-06: qty 1

## 2019-03-06 MED ORDER — APIXABAN 5 MG PO TABS: 5.0000 mg | ORAL_TABLET | Freq: Two times a day (BID) | ORAL | 1 refills | Status: DC

## 2019-03-06 MED ORDER — CALCIUM CARBONATE ANTACID 500 MG PO CHEW: 1.0000 | CHEWABLE_TABLET | Freq: Two times a day (BID) | ORAL | Status: DC | PRN

## 2019-03-06 MED ORDER — DILTIAZEM HCL ER COATED BEADS 300 MG PO CP24: 300.0000 mg | ORAL_CAPSULE | Freq: Every day | ORAL | 1 refills | Status: DC

## 2019-03-06 MED ORDER — PANTOPRAZOLE SODIUM 40 MG PO TBEC: 40.0000 mg | DELAYED_RELEASE_TABLET | Freq: Every day | ORAL | 1 refills | Status: DC

## 2019-03-06 MED FILL — ELIQUIS 5 MG TABLET: 5 | 30 days supply | Qty: 60 | Fill #0 | Status: TO

## 2019-03-06 NOTE — Progress Notes (Signed)
Family Medicine Teaching Service Daily Progress Note Intern Pager: [phone]  Patient name: Joel Baker Medical record number: [id number] Date of birth: July 05, 1961 Age: 58 y.o. Gender: male  Primary Care Provider: Maude Leriche, PA-C Consultants: Cardiology  Code Status: Full   Pt Overview and Major Events to Date:  Admitted to [facility] on 4/16 Developed Fever 4/18 Adrenal CT 4/19 Tagged WBC 4/23  Assessment and Plan: Joel Baker a 58 y.o.malepresenting with atrial flutter and pancreatitis, now with recurrent fever and leukocytosis. PMH is significant foralcohol use disorder, hypertension, sarcoidosis.  Fever/Leukocytosis: Improving  Afebrile x48 hrs.  WBC continues to improve, 19.1>16.2.  Tagged WBC scan showed possible low-level diffuse pulmonary activity suspicious for possible pneumoniitis, recommended follow-up chest radiographs.  Patient has had mutliple chest XRs, a lack of respiratory symptoms, and WBC has been decreasing, with history of known sarcoidosis, therefore no repeat at this time.  Would recommend repeat CBC in 1 week to ensure continued improvement.  Suspect that as no infectious source can be identified, leukocytosis likely 2/2 refeeding in setting of pancreatitis.   -Continue to monitor fever curve -ID consulted given unclear source of leukocytosis, appreciate recommendations  -trend WBC count   Atrial flutter with 2:1 block: resolved Dilt increased to 300mg  on 4/23, HR has improved with 93-102 over last 24 hrs. -cardiology has signed off: outpatient f/u in 2 months -on discharge will refer to Dr. Lovena Le with EP as Dr. Radford Pax notes that patient could likely benefit from ablation -cont  Eliquis 5mg  BID, sent to [facility]  -cont diltiazem 300 mg daily -Echo was deferred by cardiology as would not change current management, will need as outpatient -monitor HR   Acute pancreatitis Continues to tolerate good p.o. diet.  Continues to deny abdominal pain.   Adrenal CT with evidence of acute pancreatitis including diffuse fluid and edema.  No psuedocyst or necrosis.  Hepatitis panel negative. -Continue regular diet -Pantoprazole 40 mg twice daily -Tylenol for pain consider increasing pain regimen (very mild pain on admission) -GI consulted, appreciate recommendations: have signed off   Adrenal nodule CT on 4/16 showed 3.5 cm mass on left adrenal gland.  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  Plasma metanephrines WNL. -no follow up needed while inpatient - Endo referral as outpatient  Hypertension, chronic Had not taken HCTZ in last 74-month as outpatient.  BP continues to be WNL. -Continue to monitor  Alcohol use disorder He reports drinkingthree12 ounce beers and 2 glasses of wine daily. He reports his last drink was on 4/10. He denies ever having withdrawal symptoms in the past. 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 Is not currently taking any medication for this. -outpatient f/u  Mild Hyponatremia: Stable Na 134.  Was 131 on admission.  Unsure if chronic, but suspect it could be in setting of alcohol use.  Patient continues to take good PO.  Expect to continue to improve as PO intake continues to improve. - cont to monitor  Constipation Continues to have constipation, but still does not want a lot of medication for this.  Feels "gassy."  Had very small BM last PM. - MiraLAX QD - tums bid prn - senna prn    FEN/GI: Regular Prophylaxis:Lovenox  Disposition: home today  Subjective:  Patient feeling well.  Complaining of some abd distention and "gassy" sensation.  Reports very small BM  last night, but thinks he needs to go again and would like something for constipation.  Objective: Temp:  [99 F (37.2 C)-99.9 F (37.7 C)] 99.2 F (37.3 C) (04/24  0415) Pulse Rate:  [93-102] 95 (04/24 0415) Resp:  [17-18] 18 (04/24 0415) BP: (106-120)/(73-79) 120/79 (04/24 0415) SpO2:  [97 %-100 %] 100 % (04/24 0415) Weight:  [56.7 kg] 56.7 kg (04/24 0415)  Physical Exam: General: 58 y.o. male in NAD Cardio: RRR no m/r/g Lungs: CTAB, no wheezing, no rhonchi, no crackles Abdomen: Soft, non-tender to palpation, positive bowel sounds Skin: warm and dry Extremities: No edema  Laboratory: Recent Labs  Lab 03/04/19 0331 03/05/19 0321 03/06/19 0336  WBC 23.2* 19.1* 16.2*  HGB 11.7* 11.3* 11.0*  HCT 33.1* 32.3* 31.9*  PLT 421* 442* 491*   Recent Labs  Lab 02/28/19 0245  03/04/19 0331 03/05/19 0321 03/06/19 0336  NA 136   < > 133* 133* 134*  K 3.1*   < > 3.7 3.8 4.1  CL 101   < > 99 100 102  CO2 25   < > 26 24 25   BUN 16   < > 7 5* <5*  CREATININE 1.02   < > 0.86 0.77 0.75  CALCIUM 8.9   < > 9.7 9.7 10.0  PROT 5.7*  --  6.1*  --   --   BILITOT 1.4*  --  0.8  --   --   ALKPHOS 80  --  112  --   --   ALT 37  --  55*  --   --   AST 75*  --  78*  --   --   GLUCOSE 96   < > 98 102* 98   < > = values in this interval not displayed.   Imaging/Diagnostic Tests: Nm Wbc Scan Tumor Loc Limited  Result Date: 03/05/2019 CLINICAL DATA:  Fever of unknown origin.  Recent pancreatitis. EXAM: NUCLEAR MEDICINE LEUKOCYTE SCAN TECHNIQUE: Following intravenous administration of radiolabeled white blood cells, images of the head, neck, trunk, and extremities were obtained on subsequent days. RADIOPHARMACEUTICALS:  10.8 mCi technetium 76 M Ceretec labeled autologous leukocytes IV COMPARISON:  Chest radiographs 03/02/2019. Abdominopelvic CT 03/01/2019. FINDINGS: No abnormal focal activity is identified. There is suspicion of low-level diffuse pulmonary activity. There is normal activity within the liver, spleen, bone marrow and blood pool. Injection site noted in the right and cubital fossa. IMPRESSION: 1. Possible low-level diffuse pulmonary activity,  suspicious for possible pneumonitis. Follow-up chest radiographs recommended. 2. No abnormal focal activity identified. Electronically Signed   By: Richardean Sale M.D.   On: 03/05/2019 17:59     [place], Bernita Raisin, DO 03/06/2019, 7:45 AM PGY-1, [place] Intern pager: [phone], text pages welcome

## 2019-03-06 NOTE — TOC Transition Note (Signed)
Transition of Care [facility]) - CM/SW Discharge Note Marvetta Gibbons RN, BSN Transitions of Care Unit 4E- RN Case Manager [phone]  Patient Details  Name: Joel Baker MRN: [id number] Date of Birth: [birth year]  Transition of Care [facility]) CM/SW Contact:  Dawayne Patricia, RN Phone Number: 03/06/2019, 12:42 PM   Clinical Narrative:    Pt admitted with pancreatitis, aflutter, started on Eliquis. Pt does not have insurance- first 30 day for Eliquis filled by TOC with 30 day free card. Cm spoke with pt at bedside and provided application for pt assistance for Eliquis that pt will take with him to his f/u appointment next week on May1 at [facility] clinic for them to assist in filling out and signing paperwork to start process for Eliquis assistance. Reviewed other meds and cost- scripts [facility]- pt states he will be able to afford copay cost for other meds- pt provided GoodRx savings card to use at [facility]. No other needs noted for transition home with wife.    Final next level of care: Home/Self Care Barriers to Discharge: No Barriers Identified   Patient Goals and CMS Choice Patient states their goals for this hospitalization and ongoing recovery are:: "ready to go home" CMS Medicare.gov Compare Post Acute Care list provided to:: Patient Choice offered to / list presented to : NA  Discharge Placement  Home with wife.                      Discharge Plan and Services   Discharge Planning Services: CM Consult, [facility], Medication Assistance Post Acute Care Choice: NA          DME Arranged: N/A DME Agency: NA       HH Arranged: NA HH Agency: NA        Social Determinants of Health (SDOH) Interventions     Readmission Risk Interventions Readmission Risk Prevention Plan 03/06/2019  Post Dischage Appt Complete  Medication Screening Complete  Transportation Screening Complete  Some recent data might be hidden

## 2019-03-07 LAB — CULTURE, BLOOD (ROUTINE X 2)
Culture: NO GROWTH
Culture: NO GROWTH
Special Requests: ADEQUATE
Special Requests: ADEQUATE

## 2019-03-09 LAB — CULTURE, BLOOD (ROUTINE X 2)
Culture: NO GROWTH
Culture: NO GROWTH

## 2019-03-12 ENCOUNTER — Telehealth: Payer: Self-pay | Admitting: Cardiology

## 2019-03-12 NOTE — Telephone Encounter (Signed)
Spoke to wife. Confirmed virtual visit w/ [facility] tomorrow. Wife will be there for visit.  Pt is blind. Telephone call only.     Virtual Visit Pre-Appointment Phone Call  "(Name), I am calling you today to discuss your upcoming appointment. We are currently trying to limit exposure to the virus that causes COVID-19 by seeing patients at home rather than in the office."  1. "What is the BEST phone number to call the day of the visit?" - include this in appointment notes  2. "Do you have or have access to (through a family member/friend) a smartphone with video capability that we can use for your visit?" a. If yes - list this number in appt notes as "cell" (if different from BEST phone #) and list the appointment type as a VIDEO visit in appointment notes b. If no - list the appointment type as a PHONE visit in appointment notes  3. Confirm consent - "In the setting of the current Covid19 crisis, you are scheduled for a (phone or video) visit with your provider on (date) at (time).  Just as we do with many in-office visits, in order for you to participate in this visit, we must obtain consent.  If you'd like, I can send this to your mychart (if signed up) or email for you to review.  Otherwise, I can obtain your verbal consent now.  All virtual visits are billed to your insurance company just like a normal visit would be.  By agreeing to a virtual visit, we'd like you to understand that the technology does not allow for your provider to perform an examination, and thus may limit your provider's ability to fully assess your condition. If your provider identifies any concerns that need to be evaluated in person, we will make arrangements to do so.  Finally, though the technology is pretty good, we cannot assure that it will always work on either your or our end, and in the setting of a video visit, we may have to convert it to a phone-only visit.  In either situation, we cannot ensure that we have a  secure connection.  Are you willing to proceed?" STAFF: Did the patient verbally acknowledge consent to telehealth visit? Document YES/NO here: YES  4. Advise patient to be prepared - "Two hours prior to your appointment, go ahead and check your blood pressure, pulse, oxygen saturation, and your weight (if you have the equipment to check those) and write them all down. When your visit starts, your provider will ask you for this information. If you have an Apple Watch or Kardia device, please plan to have heart rate information ready on the day of your appointment. Please have a pen and paper handy nearby the day of the visit as well."  5. Give patient instructions for MyChart download to smartphone OR Doximity/Doxy.me as below if video visit (depending on what platform provider is using)  6. Inform patient they will receive a phone call 15 minutes prior to their appointment time (may be from unknown caller ID) so they should be prepared to answer    TELEPHONE CALL NOTE  Joel Baker has been deemed a candidate for a follow-up tele-health visit to limit community exposure during the Covid-19 pandemic. I spoke with the patient via phone to ensure availability of phone/video source, confirm preferred email & phone number, and discuss instructions and expectations.  I reminded Joel Baker to be prepared with any vital sign and/or heart rhythm information that could potentially be  obtained via home monitoring, at the time of his visit. I reminded Joel Baker to expect a phone call prior to his visit.  Stanton Kidney, RN 03/12/2019 1:31 PM   INSTRUCTIONS FOR DOWNLOADING THE MYCHART APP TO SMARTPHONE  - The patient must first make sure to have activated MyChart and know their login information - If Apple, go to [employer] and type in MyChart in the search bar and download the app. If Android, ask patient to go to [employer] and type in [place] in the search bar and download the app. The app is free  but as with any other app downloads, their phone may require them to verify saved payment information or Apple/Android password.  - The patient will need to then log into the app with their MyChart username and password, and select [facility] as their healthcare provider to link the account. When it is time for your visit, go to the MyChart app, find appointments, and click Begin Video Visit. Be sure to Select Allow for your device to access the Microphone and Camera for your visit. You will then be connected, and your provider will be with you shortly.  **If they have any issues connecting, or need assistance please contact MyChart service desk [phone])**  **If using a computer, in order to ensure the best quality for their visit they will need to use either of the following Internet Browsers: [employer], or Google Chrome**  IF USING DOXIMITY or DOXY.ME - The patient will receive a link just prior to their visit by text.     FULL LENGTH CONSENT FOR TELE-HEALTH VISIT   I hereby voluntarily request, consent and authorize [facility] and its employed or contracted physicians, physician assistants, nurse practitioners or other licensed health care professionals (the Practitioner), to provide me with telemedicine health care services (the "Services") as deemed necessary by the treating Practitioner. I acknowledge and consent to receive the Services by the Practitioner via telemedicine. I understand that the telemedicine visit will involve communicating with the Practitioner through live audiovisual communication technology and the disclosure of certain medical information by electronic transmission. I acknowledge that I have been given the opportunity to request an in-person assessment or other available alternative prior to the telemedicine visit and am voluntarily participating in the telemedicine visit.  I understand that I have the right to withhold or withdraw my consent  to the use of telemedicine in the course of my care at any time, without affecting my right to future care or treatment, and that the Practitioner or I may terminate the telemedicine visit at any time. I understand that I have the right to inspect all information obtained and/or recorded in the course of the telemedicine visit and may receive copies of available information for a reasonable fee.  I understand that some of the potential risks of receiving the Services via telemedicine include:  Marland Kitchen Delay or interruption in medical evaluation due to technological equipment failure or disruption; . Information transmitted may not be sufficient (e.g. poor resolution of images) to allow for appropriate medical decision making by the Practitioner; and/or  . In rare instances, security protocols could fail, causing a breach of personal health information.  Furthermore, I acknowledge that it is my responsibility to provide information about my medical history, conditions and care that is complete and accurate to the best of my ability. I acknowledge that Practitioner's advice, recommendations, and/or decision may be based on factors not within their control, such  as incomplete or inaccurate data provided by me or distortions of diagnostic images or specimens that may result from electronic transmissions. I understand that the practice of medicine is not an exact science and that Practitioner makes no warranties or guarantees regarding treatment outcomes. I acknowledge that I will receive a copy of this consent concurrently upon execution via email to the email address I last provided but may also request a printed copy by calling the office of [facility].    I understand that my insurance will be billed for this visit.   I have read or had this consent read to me. . I understand the contents of this consent, which adequately explains the benefits and risks of the Services being provided via telemedicine.  . I  have been provided ample opportunity to ask questions regarding this consent and the Services and have had my questions answered to my satisfaction. . I give my informed consent for the services to be provided through the use of telemedicine in my medical care  By participating in this telemedicine visit I agree to the above.

## 2019-03-12 NOTE — Telephone Encounter (Signed)
Hoyle Sauer returned call saying Trinidad Curet asked her to return call.

## 2019-03-13 ENCOUNTER — Encounter: Payer: Self-pay | Admitting: Cardiology

## 2019-03-13 ENCOUNTER — Ambulatory Visit (INDEPENDENT_AMBULATORY_CARE_PROVIDER_SITE_OTHER): Payer: Self-pay | Admitting: Family Medicine

## 2019-03-13 ENCOUNTER — Other Ambulatory Visit: Payer: Self-pay

## 2019-03-13 ENCOUNTER — Telehealth (INDEPENDENT_AMBULATORY_CARE_PROVIDER_SITE_OTHER): Payer: Self-pay | Admitting: Cardiology

## 2019-03-13 VITALS — BP 110/74 | HR 121 | Wt 126.2 lb

## 2019-03-13 DIAGNOSIS — I4892 Unspecified atrial flutter: Secondary | ICD-10-CM

## 2019-03-13 DIAGNOSIS — K852 Alcohol induced acute pancreatitis without necrosis or infection: Secondary | ICD-10-CM

## 2019-03-13 DIAGNOSIS — D72829 Elevated white blood cell count, unspecified: Secondary | ICD-10-CM

## 2019-03-13 DIAGNOSIS — E278 Other specified disorders of adrenal gland: Secondary | ICD-10-CM

## 2019-03-13 NOTE — Assessment & Plan Note (Signed)
Improving.  Counseled on dietary modification to help with continued improvement.

## 2019-03-13 NOTE — Progress Notes (Signed)
    Subjective:  Joel Baker is a 58 y.o. male who presents to the [facility] today for hospital follow-up  HPI:  Patient was recently admitted to [facility] from 4/16 to 4/24 with atrial flutter and acute pancreatitis.  Atrial flutter States that he has been able to pick up and take his Eliquis and diltiazem and tolerating both well.  He states that he feels well without any chest pain or shortness of breath.  He is able to perform all his ADLs.  He had a telemedicine visit with electrophysiology cardiology today.  He states that they are planning to do an ablation when the pandemic is over.  Pancreatitis He states that his abdominal pain is significantly improved and is only occasionally bothering him.  His pain is still in his midepigastric area.  He has no nausea or vomiting.  He states that he was not told about his adrenal mass that was found in the hospital.  He did not know that he is supposed to follow-up with an endocrinologist.    ROS: Per HPI  Social Hx: He reports that he has been smoking. He has never used smokeless tobacco. He reports current alcohol use. He reports current drug use. Drug: Marijuana.    Objective:  Physical Exam: BP 110/74   Pulse (!) 121   Wt 126 lb 4 oz (57.3 kg)   SpO2 99%   BMI 18.64 kg/m   Gen: NAD, resting comfortably CV: Tachycardic, regular, normal S1 and S2 with no murmurs appreciated Pulm: NWOB, CTAB with no crackles, wheezes, or rhonchi GI: Normal bowel sounds present. Soft, Nontender, Nondistended. MSK: no edema, cyanosis, or clubbing noted Skin: warm, dry Neuro: grossly normal, moves all extremities Psych: Normal affect and thought content  Assessment/Plan:  Atrial flutter (HCC) Continues to be tachycardic today but asymptomatic.  Continue diltiazem 300 mg daily and Eliquis 5 mg twice a day.  He brought his [place] patient assistance form which was completed and placed in fax pile.  He should continue to follow-up with  electrophysiology cardiology for the ablation is planned when the need for social distancing is over.  An echocardiogram was ordered as well.   Pancreatitis, acute Improving.  Counseled on dietary modification to help with continued improvement.  Leucocytosis Is felt that he patient had leukocytosis in the hospital to be seeding from pancreatitis.  Recheck CBC today.  Adrenal mass [facility]) Patient was informed about his incidental adrenal mass finding and counseled that he needs endocrinology follow-up.  Per chart review referral was placed on 4/24.    Orders Placed This Encounter  Procedures  . ECHOCARDIOGRAM COMPLETE    Standing Status:   Future    Standing Expiration Date:   06/12/2020    Order Specific Question:   Where should this test be performed    Answer:   [place]    Order Specific Question:   Perflutren DEFINITY (image enhancing agent) should be administered unless hypersensitivity or allergy exist    Answer:   Administer Perflutren    Order Specific Question:   Reason for exam-Echo    Answer:   Atrial Flutter  427.32 / I48.92    Bufford Lope, DO PGY-3, [facility] 03/13/2019 1:48 PM

## 2019-03-13 NOTE — Progress Notes (Signed)
Virtual Visit via Telephone Note   This visit type was conducted due to national recommendations for restrictions regarding the COVID-19 Pandemic (e.g. social distancing) in an effort to limit this patient's exposure and mitigate transmission in our community.  Due to his co-morbid illnesses, this patient is at least at moderate risk for complications without adequate follow up.  This format is felt to be most appropriate for this patient at this time.  The patient did not have access to video technology/had technical difficulties with video requiring transitioning to audio format only (telephone).  All issues noted in this document were discussed and addressed.  No physical exam could be performed with this format.  Please refer to the patient's chart for his  consent to telehealth for [facility].   Date:  03/13/2019   ID:  Joel Baker, DOB 1961/11/08, MRN [id number]  Patient Location: Home Provider Location: Home  PCP:  Scifres, Nicole Cella, PA-C  Cardiologist:  No primary care provider on file. Electrophysiologist:  None   Evaluation Performed:  Consultation - Joel Baker was referred by Lorne Skeens for the evaluation of tachycardia.  Chief Complaint:  tachycardia  History of Present Illness:    Joel Baker is a 58 y.o. male with a past history significant for hypertension presented to the hospital April 2020 with SVT and an elevated troponin.  He has a history of hypertension.  He has been having 2 days of upper abdominal pain, nausea, vomiting, and diarrhea.  He had noticed that his heart was racing with a cardiac monitor showing heart rates in the 180s.  He was placed on diltiazem which converted him to sinus tachycardia.  He had a CT scan of his abdomen which showed peripancreatic fluid and thus it was thought that he had pancreatitis.  Since his hospitalization, he has done quite well.  He has had no chest pain or shortness of breath.  He is not aware of any further episodes of  tachycardia.  He is currently able to do all of his daily activities without restriction.  He is tolerating his medications without issue.  The patient does not have symptoms concerning for COVID-19 infection (fever, chills, cough, or new shortness of breath).    Past Medical History:  Diagnosis Date  . Hypertension    No past surgical history on file.   Current Meds  Medication Sig  . apixaban (ELIQUIS) 5 MG TABS tablet Take 1 tablet (5 mg total) by mouth 2 (two) times daily.  Marland Kitchen diltiazem (CARDIZEM CD) 300 MG 24 hr capsule Take 1 capsule (300 mg total) by mouth daily.  . pantoprazole (PROTONIX) 40 MG tablet Take 1 tablet (40 mg total) by mouth daily.  Marland Kitchen thiamine 100 MG tablet Take 1 tablet (100 mg total) by mouth daily.     Allergies:   Patient has no known allergies.   Social History   Tobacco Use  . Smoking status: Current Every Day Smoker  . Smokeless tobacco: Never Used  Substance Use Topics  . Alcohol use: Yes    Comment: daily  . Drug use: Yes    Types: Marijuana    Comment: every other day     Family Hx: The patient's family history is not on file.  ROS:   Please see the history of present illness.     All other systems reviewed and are negative.   Prior CV studies:   The following studies were reviewed today:    Labs/Other Tests and Data Reviewed:  EKG:  An ECG dated 02/26/19 was personally reviewed today and demonstrated:  atrial flutter  Recent Labs: 02/27/2019: Magnesium 1.9; TSH 0.505 03/04/2019: ALT 55 03/06/2019: BUN <5; Creatinine, Ser 0.75; Hemoglobin 11.0; Platelets 491; Potassium 4.1; Sodium 134   Recent Lipid Panel Lab Results  Component Value Date/Time   CHOL 102 02/27/2019 03:19 AM   TRIG 163 (H) 02/27/2019 03:19 AM   HDL 22 (L) 02/27/2019 03:19 AM   CHOLHDL 4.6 02/27/2019 03:19 AM   LDLCALC 47 02/27/2019 03:19 AM    Wt Readings from Last 3 Encounters:  03/06/19 125 lb 1.6 oz (56.7 kg)  02/26/19 120 lb 3.2 oz (54.5 kg)      Objective:    Vital Signs:  There were no vitals taken for this visit.   VITAL SIGNS:  reviewed Over the phone, no shortness of breath.  No acute distress.  ASSESSMENT & PLAN:    1. Typical appearing atrial flutter: It is curious that his heart rate is quite fast, though his rhythm certainly does appear to be due to atrial flutter.  I discussed with him further possibilities including ablation versus continued medical management.  Risks and benefits of ablation were discussed.  Risks include bleeding, tamponade, heart block, stroke.  At this point, he would prefer to have ablation performed.  We Stephany Poorman schedule ablation once coronavirus issues have been resolved.  This patients CHA2DS2-VASc Score and unadjusted Ischemic Stroke Rate (% per year) is equal to 0.6 % stroke rate/year from a score of 1  Above score calculated as 1 point each if present [CHF, HTN, DM, Vascular=MI/PAD/Aortic Plaque, Age if 65-74, or Male] Above score calculated as 2 points each if present [Age > 75, or Stroke/TIA/TE]    2. Hypertension: No obvious signs of hypertension.  He has been unable to take his blood pressure at home.  COVID-19 Education: The signs and symptoms of COVID-19 were discussed with the patient and how to seek care for testing (follow up with PCP or arrange E-visit).  The importance of social distancing was discussed today.  Time:   Today, I have spent 15 minutes with the patient with telehealth technology discussing the above problems.     Medication Adjustments/Labs and Tests Ordered: Current medicines are reviewed at length with the patient today.  Concerns regarding medicines are outlined above.   Tests Ordered: No orders of the defined types were placed in this encounter.  This patient does not have a smart phone.  The visit was thus done over a telephone visit.  Medication Changes: No orders of the defined types were placed in this encounter.   Disposition:  Follow up in 3  month(s)  Signed, Joleigh Mineau Jorja Loa, MD  03/13/2019 10:48 AM     [facility]

## 2019-03-13 NOTE — Assessment & Plan Note (Addendum)
Continues to be tachycardic today but asymptomatic.  Continue diltiazem 300 mg daily and Eliquis 5 mg twice a day.  He brought his [place] patient assistance form which was completed and placed in fax pile.  He should continue to follow-up with electrophysiology cardiology for the ablation is planned when the need for social distancing is over.  An echocardiogram was ordered as well.

## 2019-03-13 NOTE — Patient Instructions (Signed)
We are checking some labs today. If results require attention, either myself or my nurse will get in touch with you. If everything is normal, you will get a letter in the mail or a message in My Chart. Please give [place] a call if you do not hear from [place] after 2 weeks.  Please let us know if you have not heard about getting an appointment with an endocrinologist for your adrenal mass.   Pancreatitis Eating Plan Pancreatitis is when your pancreas becomes irritated and swollen (inflamed). The pancreas is a small organ located behind your stomach. It helps your body digest food and regulate your blood sugar. Pancreatitis can affect how your body digests food, especially foods with fat. You may also have other symptoms such as abdominal pain or nausea. When you have pancreatitis, following a low-fat eating plan may help you manage symptoms and recover more quickly. Work with your health care provider or a diet and nutrition specialist (dietitian) to create an eating plan that is right for you. What are tips for following this plan? Reading food labels Use the information on food labels to help keep track of how much fat you eat:  Check the serving size.  Look for the amount of total fat in grams (g) in one serving. ? Low-fat foods have 3 g of fat or less per serving. ? Fat-free foods have 0.5 g of fat or less per serving.  Keep track of how much fat you eat based on how many servings you eat. ? For example, if you eat two servings, the amount of fat you eat will be two times what is listed on the label. Shopping   Buy low-fat or nonfat foods, such as: ? Fresh, frozen, or canned fruits and vegetables. ? Grains, including pasta, bread, and rice. ? Lean meat, poultry, fish, and other protein foods. ? Low-fat or nonfat dairy.  Avoid buying bakery products and other sweets made with whole milk, butter, and eggs.  Avoid buying snack foods with added fat, such as anything with butter or cheese  flavoring. Cooking  Remove skin from poultry, and remove extra fat from meat.  Limit the amount of fat and oil you use to 6 teaspoons or less per day.  Cook using low-fat methods, such as boiling, broiling, grilling, steaming, or baking.  Use spray oil to cook. Add fat-free chicken broth to add flavor and moisture.  Avoid adding cream to thicken soups or sauces. Use other thickeners such as corn starch or tomato paste. Meal planning   Eat a low-fat diet as told by your dietitian. For most people, this means having no more than 55-65 grams of fat each day.  Eat small, frequent meals throughout the day. For example, you may have 5-6 small meals instead of 3 large meals.  Drink enough fluid to keep your urine pale yellow.  Do not drink alcohol. Talk to your health care provider if you need help stopping.  Limit how much caffeine you have, including black coffee, black and green tea, caffeinated soft drinks, and energy drinks. General information  Let your health care provider or dietitian know if you have unplanned weight loss on this eating plan.  You may be instructed to follow a clear liquid diet during a flare of symptoms. Talk with your health care provider about how to manage your diet during symptoms of a flare.  Take any vitamins or supplements as told by your health care provider.  Work with a Microbiologist, especially  if you have other conditions such as obesity or diabetes mellitus. What foods should I avoid? Fruits Fried fruits. Fruits served with butter or cream. Vegetables Fried vegetables. Vegetables cooked with butter, cheese, or cream. Grains Biscuits, waffles, donuts, pastries, and croissants. Pies and cookies. Butter-flavored popcorn. Regular crackers. Meats and other protein foods Fatty cuts of meat. Poultry with skin. Organ meats. Bacon, sausage, and cold cuts. Whole eggs. Nuts and nut butters. Dairy Whole and 2% milk. Whole milk yogurt. Whole milk ice cream.  Cream and half-and-half. Cream cheese. Sour cream. Cheese. Beverages Wine, beer, and liquor. The items listed above may not be a complete list of foods and beverages to avoid. Contact a dietitian for more information. Summary  Pancreatitis can affect how your body digests food, especially foods with fat.  When you have pancreatitis, it is recommended that you follow a low-fat eating plan to help you recover more quickly and manage symptoms. For most people, this means limiting fat to no more than 55-65 grams per day.  Do not drink alcohol. Limit the amount of caffeine you have, and drink enough fluid to keep your urine pale yellow. This information is not intended to replace advice given to you by your health care provider. Make sure you discuss any questions you have with your health care provider. Document Released: 02/04/2018 Document Revised: 02/04/2018 Document Reviewed: 02/04/2018 Elsevier Interactive Patient Education  2019 [employer].

## 2019-03-13 NOTE — Assessment & Plan Note (Signed)
Patient was informed about his incidental adrenal mass finding and counseled that he needs endocrinology follow-up.  Per chart review referral was placed on 4/24.

## 2019-03-13 NOTE — Assessment & Plan Note (Signed)
Is felt that he patient had leukocytosis in the hospital to be seeding from pancreatitis.  Recheck CBC today.

## 2019-03-14 ENCOUNTER — Encounter: Payer: Self-pay | Admitting: Family Medicine

## 2019-03-14 LAB — CBC WITH DIFFERENTIAL/PLATELET
Basophils Absolute: 0.1 10*3/uL (ref 0.0–0.2)
Basos: 1 %
EOS (ABSOLUTE): 0.1 10*3/uL (ref 0.0–0.4)
Eos: 1 %
Hematocrit: 32 % — ABNORMAL LOW (ref 37.5–51.0)
Hemoglobin: 11.2 g/dL — ABNORMAL LOW (ref 13.0–17.7)
Immature Grans (Abs): 0.1 10*3/uL (ref 0.0–0.1)
Immature Granulocytes: 1 %
Lymphocytes Absolute: 1.9 10*3/uL (ref 0.7–3.1)
Lymphs: 24 %
MCH: 35 pg — ABNORMAL HIGH (ref 26.6–33.0)
MCHC: 35 g/dL (ref 31.5–35.7)
MCV: 100 fL — ABNORMAL HIGH (ref 79–97)
Monocytes Absolute: 0.5 10*3/uL (ref 0.1–0.9)
Monocytes: 6 %
Neutrophils Absolute: 5.3 10*3/uL (ref 1.4–7.0)
Neutrophils: 67 %
Platelets: 688 10*3/uL — ABNORMAL HIGH (ref 150–450)
RBC: 3.2 x10E6/uL — ABNORMAL LOW (ref 4.14–5.80)
RDW: 13.3 % (ref 11.6–15.4)
WBC: 7.9 10*3/uL (ref 3.4–10.8)

## 2019-03-20 ENCOUNTER — Other Ambulatory Visit: Payer: Self-pay

## 2019-03-20 ENCOUNTER — Ambulatory Visit (HOSPITAL_COMMUNITY)
Admission: RE | Admit: 2019-03-20 | Discharge: 2019-03-20 | Disposition: A | Discharge status: Home / Self Care | Payer: Self-pay | Source: Ambulatory Visit | Attending: Family Medicine | Admitting: Family Medicine

## 2019-03-20 ENCOUNTER — Encounter: Payer: Self-pay | Admitting: Family Medicine

## 2019-03-20 DIAGNOSIS — I10 Essential (primary) hypertension: Secondary | ICD-10-CM | POA: Insufficient documentation

## 2019-03-20 DIAGNOSIS — I4892 Unspecified atrial flutter: Secondary | ICD-10-CM | POA: Insufficient documentation

## 2019-03-20 NOTE — Progress Notes (Signed)
  Echocardiogram 2D Echocardiogram has been performed.  Jennette Dubin 03/20/2019, 9:57 AM

## 2019-03-23 ENCOUNTER — Telehealth: Payer: Self-pay | Admitting: *Deleted

## 2019-03-23 NOTE — Telephone Encounter (Signed)
Pt called because we did not put his name and DOB on Medication assistance form.  Form found, completed and faxed again.  Copy placed in batch scanning. Christen Bame, CMA

## 2019-03-30 ENCOUNTER — Telehealth: Payer: Self-pay | Admitting: Family Medicine

## 2019-03-30 NOTE — Telephone Encounter (Signed)
Received fax that patient was denied eliquis from [employer] patient assistance foundation due to his income. Please let patient know.

## 2019-03-31 NOTE — Telephone Encounter (Signed)
Spoke with patient and states that he is appealing this decision because there was an error on the form.  He will be sending new income information to them.  Jazmin Hartsell,CMA

## 2019-04-02 NOTE — Telephone Encounter (Signed)
New fax received that approved.

## 2019-04-27 ENCOUNTER — Other Ambulatory Visit: Payer: Self-pay | Admitting: Family Medicine

## 2019-05-06 ENCOUNTER — Encounter: Payer: Self-pay | Admitting: Internal Medicine

## 2019-05-06 ENCOUNTER — Other Ambulatory Visit: Payer: Self-pay

## 2019-05-06 ENCOUNTER — Ambulatory Visit (INDEPENDENT_AMBULATORY_CARE_PROVIDER_SITE_OTHER): Payer: Self-pay | Admitting: Internal Medicine

## 2019-05-06 VITALS — BP 120/82 | HR 82 | Temp 98.4°F | Resp 12 | Ht 69.0 in | Wt 129.4 lb

## 2019-05-06 DIAGNOSIS — Z7901 Long term (current) use of anticoagulants: Secondary | ICD-10-CM

## 2019-05-06 DIAGNOSIS — I4892 Unspecified atrial flutter: Secondary | ICD-10-CM

## 2019-05-06 NOTE — Progress Notes (Signed)
OFFICE NOTE  Chief Complaint:  No complaints  Primary Care Physician: Cleophas Dunker, DO  HPI:  Joel Baker is a 58 y.o. male with a past medial history significant for recent hospitalization for abdominal pain and mild chest discomfort with palpitations and SVT versus atrial flutter.  There was evidence of possible acute pancreatitis in the setting of alcohol use.  AST and ALT were elevated.  He was started on diltiazem and switch to oral Cardizem.  He is now on Eliquis.  Fortunately converted to sinus rhythm.  In the interim he saw Dr. Curt Bears via telemedicine visit.  They discussed the possibility of atrial flutter ablation which she is agreeable to however it was not scheduled due to the COVID-19 pandemic.  Today he is asymptomatic, again remains in sinus rhythm.  Blood pressures well controlled.  He reports compliance on Eliquis.  He did ask her whether he could continue to drink alcohol and I strongly advised against it.  I suspect he is still using it.  PMHx:  Past Medical History:  Diagnosis Date  . Hypertension     No past surgical history on file.  FAMHx:  No family history on file.  SOCHx:   reports that he has been smoking. He has never used smokeless tobacco. He reports current alcohol use. He reports current drug use. Drug: Marijuana.  ALLERGIES:  No Known Allergies  ROS: Pertinent items noted in HPI and remainder of comprehensive ROS otherwise negative.  HOME MEDS: Current Outpatient Medications on File Prior to Visit  Medication Sig Dispense Refill  . apixaban (ELIQUIS) 5 MG TABS tablet Take 1 tablet (5 mg total) by mouth 2 (two) times daily. 60 tablet 1  . diltiazem (CARDIZEM CD) 300 MG 24 hr capsule TAKE 1 CAPSULE BY MOUTH EVERY DAY 30 capsule 1  . pantoprazole (PROTONIX) 40 MG tablet TAKE 1 TABLET BY MOUTH EVERY DAY 30 tablet 1  . thiamine 100 MG tablet Take 1 tablet (100 mg total) by mouth daily. 30 tablet 1   No current facility-administered  medications on file prior to visit.     LABS/IMAGING: No results found for this or any previous visit (from the past 48 hour(s)). No results found.  LIPID PANEL:    Component Value Date/Time   CHOL 102 02/27/2019 0319   TRIG 163 (H) 02/27/2019 0319   HDL 22 (L) 02/27/2019 0319   CHOLHDL 4.6 02/27/2019 0319   VLDL 33 02/27/2019 0319   LDLCALC 47 02/27/2019 0319     WEIGHTS: Wt Readings from Last 3 Encounters:  05/06/19 129 lb 6.4 oz (58.7 kg)  03/13/19 126 lb 4 oz (57.3 kg)  03/06/19 125 lb 1.6 oz (56.7 kg)    VITALS: BP 120/82   Pulse 82   Temp 98.4 F (36.9 C)   Resp 12   Ht 5\' 9"  (1.753 m)   Wt 129 lb 6.4 oz (58.7 kg)   SpO2 99%   BMI 19.11 kg/m   EXAM: General appearance: alert and no distress Neck: no carotid bruit, no JVD and thyroid not enlarged, symmetric, no tenderness/mass/nodules Lungs: clear to auscultation bilaterally Heart: regular rate and rhythm Abdomen: soft, non-tender; bowel sounds normal; no masses,  no organomegaly Extremities: extremities normal, atraumatic, no cyanosis or edema Pulses: 2+ and symmetric Skin: Skin color, texture, turgor normal. No rashes or lesions Neurologic: Grossly normal Psych: Pleasant  EKG: Normal sinus rhythm at 60- personally reviewed  ASSESSMENT: 1. Paroxysmal atrial flutter 2. Anticoagulated on Eliquis 3. Alcohol  abuse with pancreatitis  PLAN: 1.   Mr. Tench has done well and has maintained sinus rhythm after discharge.  He is on Eliquis for anticoagulation and reports compliance with the medication.  He may still be using alcohol and I have encouraged him to be abstinent with this.  He had already discussed a flutter ablation with Dr. Curt Bears.  He is interested in proceeding with that.  I will route my note back to Dr. Curt Bears to see if this could be scheduled in the near future.  Otherwise he can follow-up with Dr. Curt Bears as his primary issue is his cardiac arrhythmia.  Pixie Casino, MD, [facility], [place] Director of the [facility] Lipid Disorders &  Cardiovascular Risk Reduction Clinic Diplomate of the American Board of Clinical Lipidology Attending Cardiologist  Direct Dial: [phone]  Fax: [phone]  Website:  [email or web address] 05/06/2019, 9:57 AM

## 2019-05-06 NOTE — Patient Instructions (Signed)
Medication Instructions:  Continue current medications If you need a refill on your cardiac medications before your next appointment, please call your pharmacy.   Follow-Up: as needed with Dr. Debara Pickett  Continue routine follow up as directed with Dr. Curt Bears

## 2019-05-13 ENCOUNTER — Telehealth: Payer: Self-pay | Admitting: *Deleted

## 2019-05-13 NOTE — Telephone Encounter (Signed)
Pt made aware that I will follow up with him in next few weeks to arrange procedure for August. Pt appreciates my call and agreeable to plan.

## 2019-06-26 ENCOUNTER — Telehealth: Payer: Self-pay | Admitting: *Deleted

## 2019-06-26 NOTE — Telephone Encounter (Signed)
Pt not sure that he wants/needs the procedure anymore.  He would like to discuss with Dr. Curt Bears more first. Pt scheduled for 9/1 to further discuss. Patient verbalized understanding and agreeable to plan.

## 2019-06-30 ENCOUNTER — Other Ambulatory Visit: Payer: Self-pay | Admitting: Family Medicine

## 2019-07-14 ENCOUNTER — Ambulatory Visit (INDEPENDENT_AMBULATORY_CARE_PROVIDER_SITE_OTHER): Payer: Self-pay | Admitting: Cardiology

## 2019-07-14 ENCOUNTER — Other Ambulatory Visit: Payer: Self-pay

## 2019-07-14 ENCOUNTER — Encounter: Payer: Self-pay | Admitting: Cardiology

## 2019-07-14 VITALS — BP 128/86 | HR 83 | Ht 69.0 in | Wt 136.2 lb

## 2019-07-14 DIAGNOSIS — I483 Typical atrial flutter: Secondary | ICD-10-CM

## 2019-07-14 MED ORDER — DILTIAZEM HCL 30 MG PO TABS: 30.0000 mg | ORAL_TABLET | Freq: Four times a day (QID) | ORAL | 2 refills | Status: DC | PRN

## 2019-07-14 NOTE — Addendum Note (Signed)
Addended by: Stanton Kidney on: 07/14/2019 03:51 PM   Modules accepted: Orders

## 2019-07-14 NOTE — Patient Instructions (Signed)
Medication Instructions:  Your physician has recommended you make the following change in your medication:  1. START Diltiazem 30 mg, take 1-2 tablets every 6 hours as needed, for palpitations  * If you need a refill on your cardiac medications before your next appointment, please call your pharmacy.   Labwork: None ordered If you have labs (blood work) drawn today and your tests are completely normal, you will receive your results only by:  [place] (if you have MyChart) OR  A paper copy in the mail If you have any lab test that is abnormal or we need to change your treatment, we will call you to review the results.  Testing/Procedures: None ordered  Follow-Up: Your physician wants you to follow-up in: 1 year with Dr. Curt Bears.  You will receive a reminder letter in the mail two months in advance. If you don't receive a letter, please call our office to schedule the follow-up appointment.   Thank you for choosing [facility]!!   Trinidad Curet, RN [phone]  Any Other Special Instructions Will Be Listed Below (If Applicable).

## 2019-07-14 NOTE — Progress Notes (Signed)
Electrophysiology Office Note   Date:  07/14/2019   ID:  Joel Baker, DOB 05/29/1961, MRN [id number]  PCP:  Unknown Jim, DO  Cardiologist:  [place] Primary Electrophysiologist:  Domini Vandehei Jorja Loa, MD    Chief Complaint: SVT   History of Present Illness: Joel Baker is a 58 y.o. male who is being seen today for the evaluation of SVT at the request of Joel Baker, *. Presenting today for electrophysiology evaluation.  Has a history of hypertension.  He presented to the hospital April 2020 with episodes of SVT and an elevated troponin.  He had 2 days of abdominal pain nausea vomiting and diarrhea.  His heart rates were found to be in the 180s.  He was started on diltiazem which converted him to sinus rhythm.  CT of the abdomen showed peripancreatic fluid.  It was thought that he had pancreatitis.  Today, he denies symptoms of palpitations, chest pain, shortness of breath, orthopnea, PND, lower extremity edema, claudication, dizziness, presyncope, syncope, bleeding, or neurologic sequela. The patient is tolerating medications without difficulties.  Since his episode of atrial flutter, he has had no further episodes.  He feels well without complaint.   Past Medical History:  Diagnosis Date  . Hypertension    History reviewed. No pertinent surgical history.   Current Outpatient Medications  Medication Sig Dispense Refill  . atropine 1 % ophthalmic solution Place 1 drop into both eyes daily.    . prednisoLONE acetate (PRED FORTE) 1 % ophthalmic suspension Place 1 drop into the right eye 4 (four) times daily.    Marland Kitchen thiamine (VITAMIN B-1) 100 MG tablet TAKE 1 TABLET BY MOUTH EVERY DAY 30 tablet 1   No current facility-administered medications for this visit.     Allergies:   Patient has no known allergies.   Social History:  The patient  reports that he has been smoking. He has never used smokeless tobacco. He reports current alcohol use. He reports current drug use.  Drug: Marijuana.   Family History:  The patient's family history includes Diabetes in his brother, brother, mother, and sister.    ROS:  Please see the history of present illness.   Otherwise, review of systems is positive for none.   All other systems are reviewed and negative.    PHYSICAL EXAM: VS:  BP 128/86   Pulse 83   Ht 5\' 9"  (1.753 m)   Wt 136 lb 3.2 oz (61.8 kg)   SpO2 98%   BMI 20.11 kg/m  , BMI Body mass index is 20.11 kg/m. GEN: Well nourished, well developed, in no acute distress  HEENT: normal  Neck: no JVD, carotid bruits, or masses Cardiac: RRR; no murmurs, rubs, or gallops,no edema  Respiratory:  clear to auscultation bilaterally, normal work of breathing GI: soft, nontender, nondistended, + BS MS: no deformity or atrophy  Skin: warm and dry Neuro:  Strength and sensation are intact Psych: euthymic mood, full affect  EKG:  EKG is ordered today. Personal review of the ekg ordered shows SR, PVCs, rate 83  Recent Labs: 02/27/2019: Magnesium 1.9; TSH 0.505 03/04/2019: ALT 55 03/06/2019: BUN <5; Creatinine, Ser 0.75; Potassium 4.1; Sodium 134 03/13/2019: Hemoglobin 11.2; Platelets 688    Lipid Panel     Component Value Date/Time   CHOL 102 02/27/2019 0319   TRIG 163 (H) 02/27/2019 0319   HDL 22 (L) 02/27/2019 0319   CHOLHDL 4.6 02/27/2019 0319   VLDL 33 02/27/2019 0319   LDLCALC 47 02/27/2019  0319     Wt Readings from Last 3 Encounters:  07/14/19 136 lb 3.2 oz (61.8 kg)  05/06/19 129 lb 6.4 oz (58.7 kg)  03/13/19 126 lb 4 oz (57.3 kg)      Other studies Reviewed: Additional studies/ records that were reviewed today include: TTE 03/20/19  Review of the above records today demonstrates:   1. The left ventricle has low normal systolic function, with an ejection fraction of 50-55%. The cavity size was normal. Left ventricular diastolic Doppler parameters are consistent with impaired relaxation. No evidence of left ventricular regional wall  motion  abnormalities.  2. The right ventricle has normal systolic function. The cavity was normal. There is no increase in right ventricular wall thickness.  3. The aortic root is normal in size and structure.   ASSESSMENT AND PLAN:  1.  Typical appearing atrial flutter: Patient prefers to have ablation performed.  Is currently not anticoagulated though stroke risk is low.  He currently wishes to avoid ablation at this time and treat medically.  We Riley Papin hold off on restarting Eliquis.  We Sharrod Achille give him PRN diltiazem.  This patients CHA2DS2-VASc Score and unadjusted Ischemic Stroke Rate (% per year) is equal to 0.6 % stroke rate/year from a score of 1  Above score calculated as 1 point each if present [CHF, HTN, DM, Vascular=MI/PAD/Aortic Plaque, Age if 65-74, or Male] Above score calculated as 2 points each if present [Age > 75, or Stroke/TIA/TE]   2.  Hypertension: Currently well controlled and not on medications.  It is unclear whether or not he continues to have hypertension.    Current medicines are reviewed at length with the patient today.   The patient does not have concerns regarding his medicines.  The following changes were made today: Diltiazem 30 mg as needed  Labs/ tests ordered today include:  Orders Placed This Encounter  Procedures  . EKG 12-Lead     Disposition:   FU with Leshia Kope 1 year  Signed, Janyth Riera Jorja Loa, MD  07/14/2019 3:39 PM     [facility] [address] [place] [address] [phone] (office) [phone] (fax)

## 2020-02-03 IMAGING — DX CHEST - 2 VIEW
2 series · 2 of 2 positions shown · non-contrast
Comparison: 02/28/2019

CLINICAL DATA: Shortness of breath

EXAM:
CHEST - 2 VIEW

[chest lat]
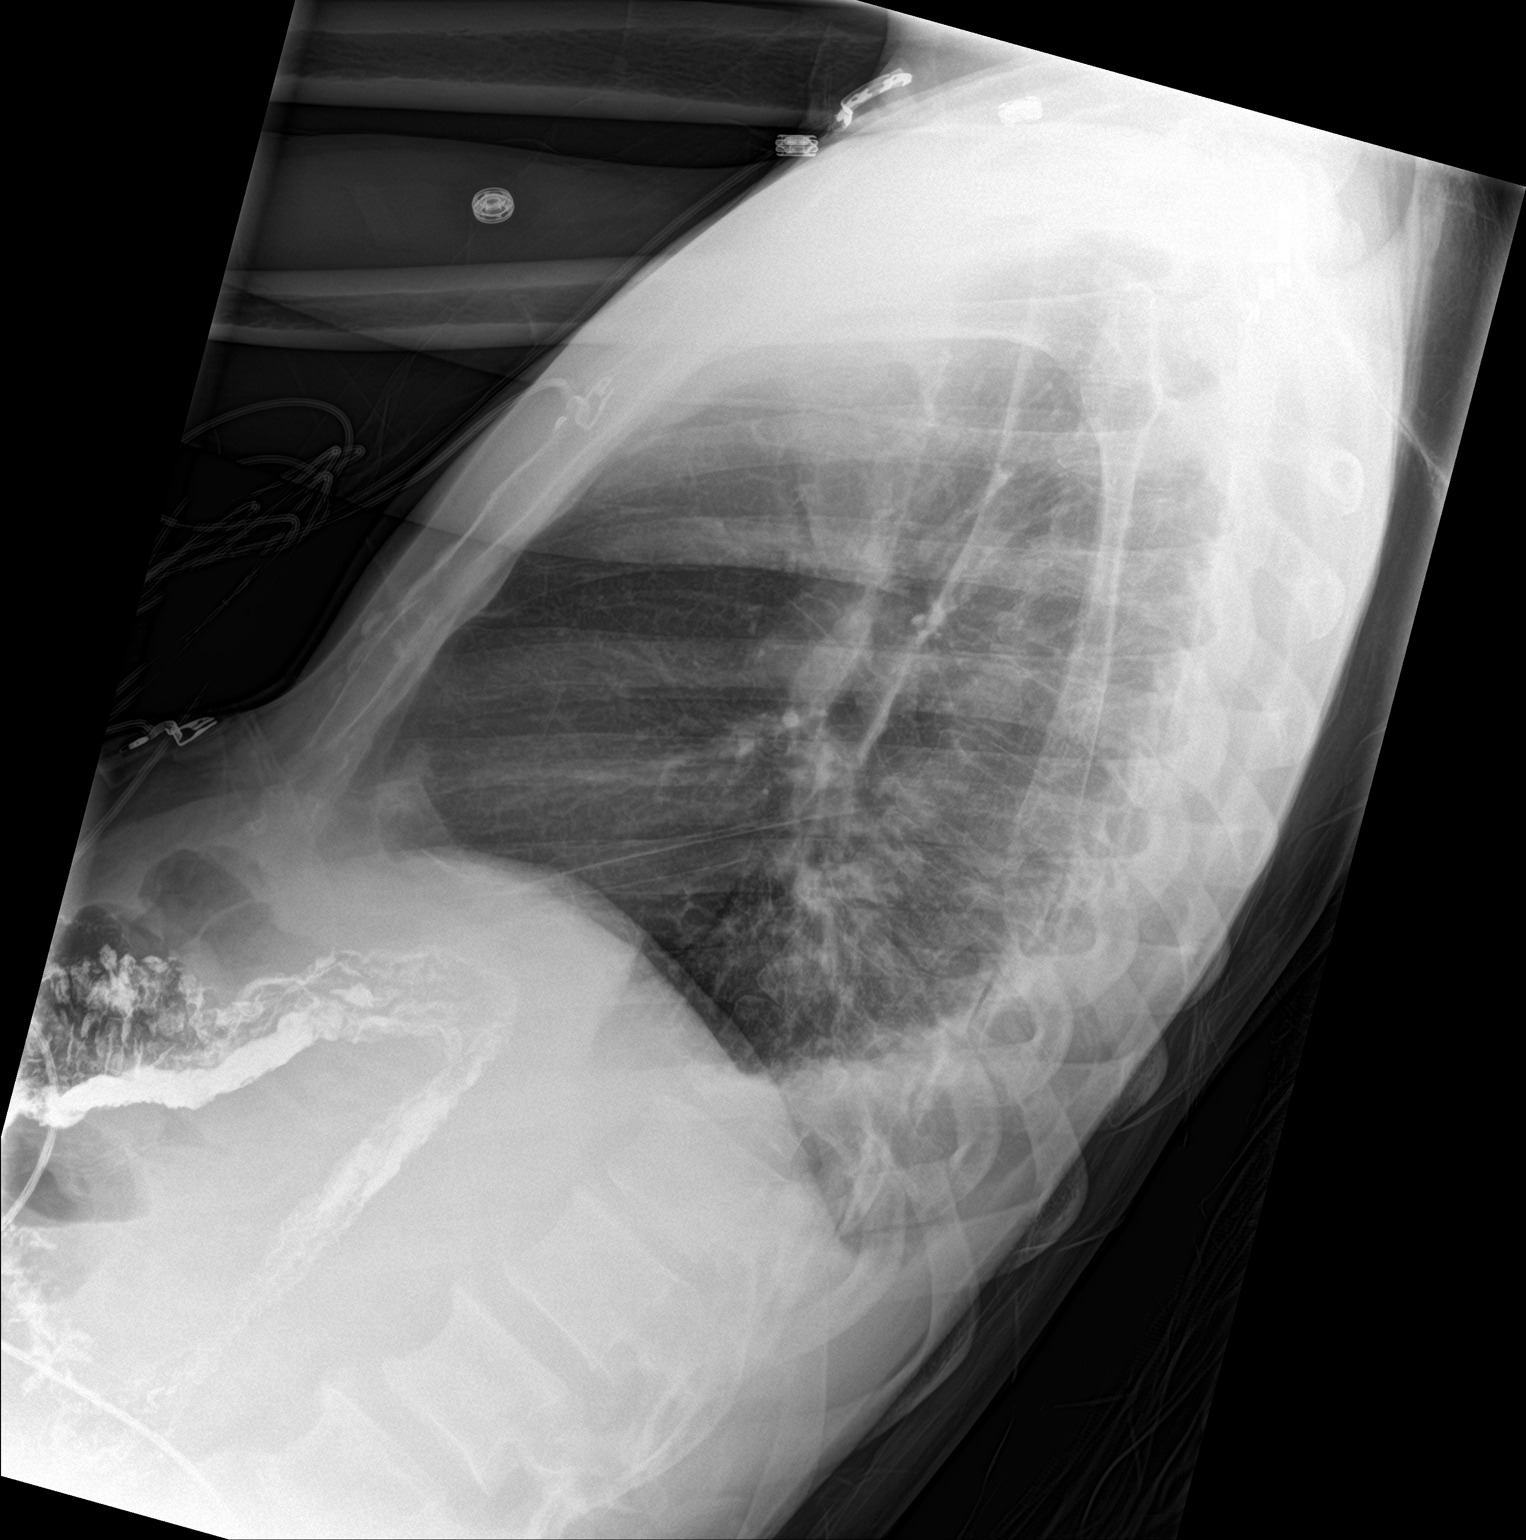

[chest ap]
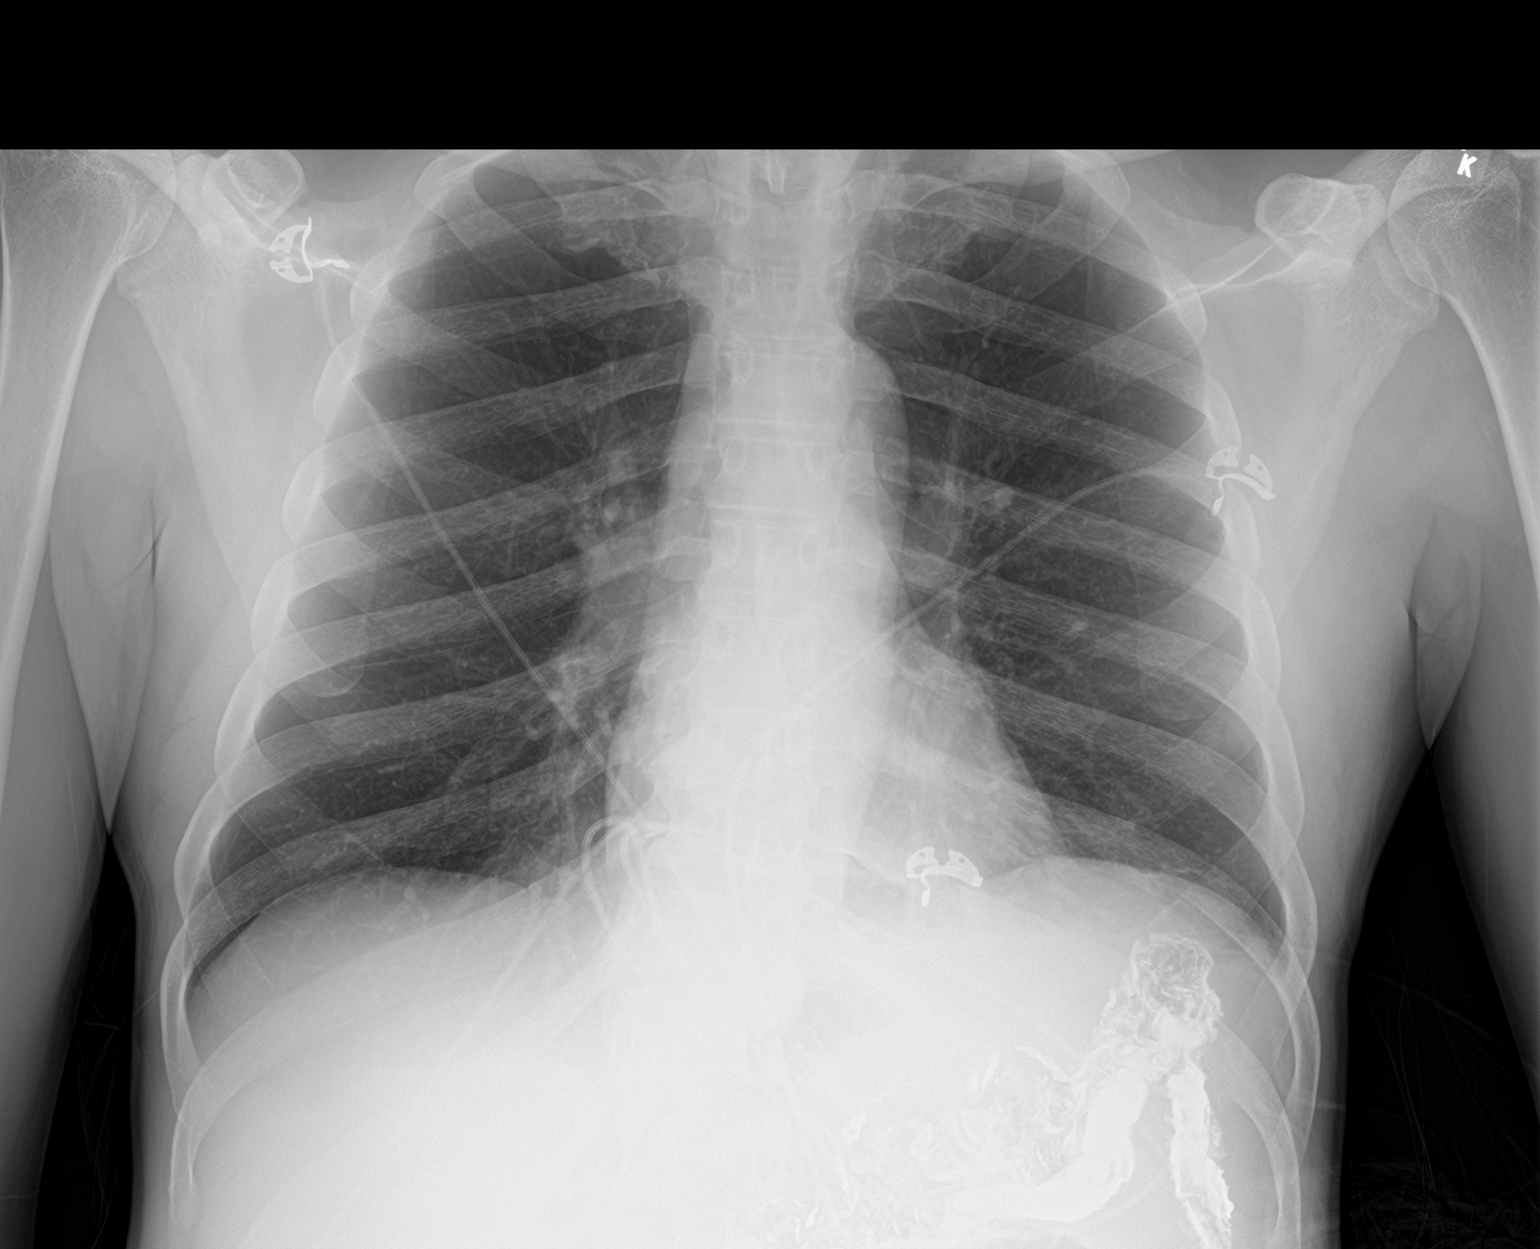

[2 of 2 positions shown; findings below may reference images not displayed]

FINDINGS: Is heart is normal size. Small left pleural effusion with left base
atelectasis. Right lung clear. No acute bony abnormality.
IMPRESSION: Small left effusion with left base atelectasis.

## 2020-02-06 IMAGING — NM NM WBC SCAN TUMOR LOC LIMITED
2 series · 2 of 2 positions shown · non-contrast
Comparison: Chest radiographs 03/02/2019. Abdominopelvic CT
03/01/2019.

CLINICAL DATA: Fever of unknown origin.  Recent pancreatitis.

EXAM:
NUCLEAR MEDICINE LEUKOCYTE SCAN
TECHNIQUE: Following intravenous administration of radiolabeled white blood
cells, images of the head, neck, trunk, and extremities were
obtained on subsequent days.
RADIOPHARMACEUTICALS:  10.8 mCi technetium 99 M Ceretec labeled
autologous leukocytes IV

[Series 1: whole body · 2.66mm/px · 1 of 1 slices shown (1 of 2)]
[im 1/1]
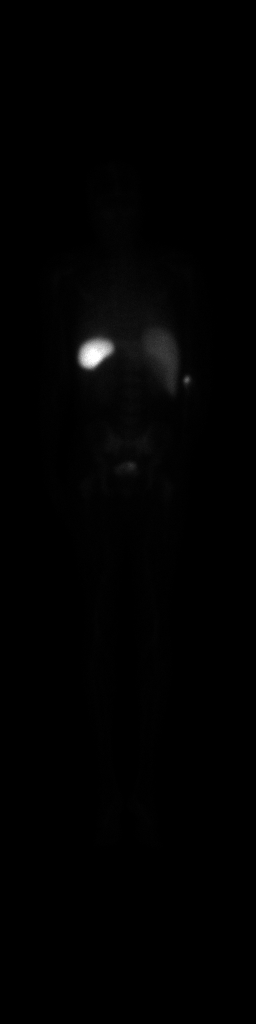

[Series 1: whole body · 2.66mm/px · 1 of 1 slices shown (2 of 2)]
[im 1/1]
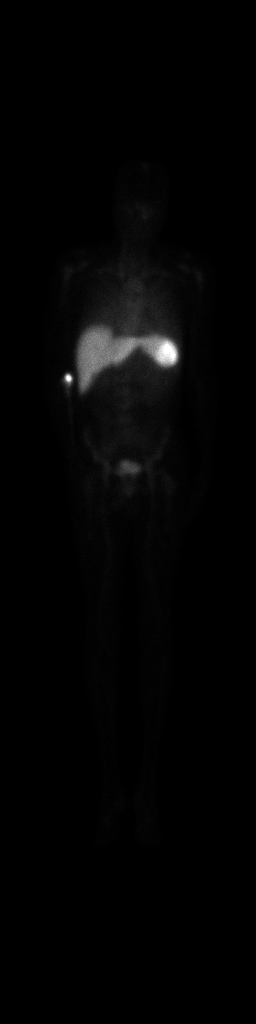

[2 of 2 positions shown; findings below may reference images not displayed]

FINDINGS: No abnormal focal activity is identified. There is suspicion of
low-level diffuse pulmonary activity. There is normal activity
within the liver, spleen, bone marrow and blood pool. Injection site
noted in the right and cubital fossa.
IMPRESSION: 1. Possible low-level diffuse pulmonary activity, suspicious for
possible pneumonitis. Follow-up chest radiographs recommended.
2. No abnormal focal activity identified.

## 2020-07-14 ENCOUNTER — Ambulatory Visit: Payer: Self-pay | Admitting: Student

## 2020-08-08 NOTE — Progress Notes (Signed)
PCP:  Cleophas Dunker, DO Primary Cardiologist: No primary care provider on file. Electrophysiologist: Will Meredith Leeds, MD   Joel Baker is a 59 y.o. male seen today for Will Meredith Leeds, MD for routine electrophysiology followup.  Since last being seen in our clinic the patient reports doing well. He has occasional tachy palpitations. Usually after smoking weed.   he denies chest pain, dyspnea, PND, orthopnea, nausea, vomiting, dizziness, syncope, edema, weight gain, or early satiety. He has been watching his diet and taking ensure to try and gain weight. He has not been seen by a PCP in about 1 year.   Past Medical History:  Diagnosis Date  . Hypertension    No past surgical history on file.  Current Outpatient Medications  Medication Sig Dispense Refill  . atropine 1 % ophthalmic solution Place 1 drop into both eyes daily.    Marland Kitchen thiamine (VITAMIN B-1) 100 MG tablet TAKE 1 TABLET BY MOUTH EVERY DAY 30 tablet 1   No current facility-administered medications for this visit.    No Known Allergies  Social History   Socioeconomic History  . Marital status: Married    Spouse name: Not on file  . Number of children: Not on file  . Years of education: Not on file  . Highest education level: Not on file  Occupational History  . Not on file  Tobacco Use  . Smoking status: Current Every Day Smoker  . Smokeless tobacco: Never Used  Vaping Use  . Vaping Use: Never used  Substance and Sexual Activity  . Alcohol use: Yes    Comment: daily  . Drug use: Yes    Types: Marijuana    Comment: every other day  . Sexual activity: Not on file  Other Topics Concern  . Not on file  Social History Narrative  . Not on file   Social Determinants of Health   Financial Resource Strain:   . Difficulty of Paying Living Expenses: Not on file  Food Insecurity:   . Worried About Charity fundraiser in the Last Year: Not on file  . Ran Out of Food in the Last Year: Not on file    Transportation Needs:   . Lack of Transportation (Medical): Not on file  . Lack of Transportation (Non-Medical): Not on file  Physical Activity:   . Days of Exercise per Week: Not on file  . Minutes of Exercise per Session: Not on file  Stress:   . Feeling of Stress : Not on file  Social Connections:   . Frequency of Communication with Friends and Family: Not on file  . Frequency of Social Gatherings with Friends and Family: Not on file  . Attends Religious Services: Not on file  . Active Member of Clubs or Organizations: Not on file  . Attends Archivist Meetings: Not on file  . Marital Status: Not on file  Intimate Partner Violence:   . Fear of Current or Ex-Partner: Not on file  . Emotionally Abused: Not on file  . Physically Abused: Not on file  . Sexually Abused: Not on file     Review of Systems: All other systems reviewed and are otherwise negative except as noted above.  Physical Exam: Vitals:   08/09/20 0946  BP: 130/80  Pulse: 75  SpO2: 99%  Weight: 131 lb 9.6 oz (59.7 kg)  Height: 5\' 9"  (1.753 m)    GEN- The patient is well appearing, alert and oriented x 3 today.  HEENT: normocephalic, atraumatic; sclera clear, conjunctiva pink; hearing intact; oropharynx clear; neck supple, no JVP Lymph- no cervical lymphadenopathy Lungs- Clear to ausculation bilaterally, normal work of breathing.  No wheezes, rales, rhonchi Heart- Regular rate and rhythm, no murmurs, rubs or gallops, PMI not laterally displaced GI- soft, non-tender, non-distended, bowel sounds present, no hepatosplenomegaly Extremities- no clubbing, cyanosis, or edema; DP/PT/radial pulses 2+ bilaterally MS- no significant deformity or atrophy Skin- warm and dry, no rash or lesion. Approximately 2 cm round, highly mobile mass just below his R shoulder plain. 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  Psych- euthymic mood, full affect Neuro- strength and sensation are intact  EKG is ordered. Personal review of  EKG from today shows NSR at 75 bpm, personally reviewed  Additional studies reviewed include: Previous EP office notes  Assessment and Plan:  1. Typical appearing atrial flutter Previously discussed ablation with Dr Curt Bears but pt wished to avoid at the time.  Continue prn diltiazem. He is not on Eliquis with a CHA2DS2VASC of 1    2. HTN Continue current medications  3. ? Lipoma  Approximately 2 cm round, highly mobile mass just below his R shoulder plain. Recommended PCP follow up. It is currently not bothering him, he is just curios what it is.   RTC annually to see Dr. Curt Bears. Sooner with symptoms.   Shirley Friar, PA-C  08/09/20 9:56 AM

## 2020-08-09 ENCOUNTER — Ambulatory Visit (INDEPENDENT_AMBULATORY_CARE_PROVIDER_SITE_OTHER): Payer: Medicare Other | Admitting: Student

## 2020-08-09 ENCOUNTER — Other Ambulatory Visit: Payer: Self-pay

## 2020-08-09 ENCOUNTER — Encounter: Payer: Self-pay | Admitting: Student

## 2020-08-09 VITALS — BP 130/80 | HR 75 | Ht 69.0 in | Wt 131.6 lb

## 2020-08-09 DIAGNOSIS — I483 Typical atrial flutter: Secondary | ICD-10-CM

## 2020-08-09 DIAGNOSIS — I10 Essential (primary) hypertension: Secondary | ICD-10-CM | POA: Diagnosis not present

## 2020-08-09 NOTE — Patient Instructions (Signed)
Medication Instructions:  *If you need a refill on your cardiac medications before your next appointment, please call your pharmacy*  Follow-Up: At [facility], you and your health needs are our priority.  As part of our continuing mission to provide you with exceptional heart care, we have created designated Provider Care Teams.  These Care Teams include your primary Cardiologist (physician) and Advanced Practice Providers (APPs -  Physician Assistants and Nurse Practitioners) who all work together to provide you with the care you need, when you need it.  We recommend signing up for the patient portal called "MyChart".  Sign up information is provided on this After Visit Summary.  MyChart is used to connect with patients for Virtual Visits (Telemedicine).  Patients are able to view lab/test results, encounter notes, upcoming appointments, etc.  Non-urgent messages can be sent to your provider as well.   To learn more about what you can do with MyChart, go to [email or web address].    Your next appointment:   Your physician wants you to follow-up in: 1 YEAR with Dr. Curt Bears. You will receive a reminder letter in the mail two months in advance. If you don't receive a letter, please call our office to schedule the follow-up appointment.

## 2020-08-22 ENCOUNTER — Other Ambulatory Visit: Payer: Self-pay

## 2020-08-22 ENCOUNTER — Ambulatory Visit (INDEPENDENT_AMBULATORY_CARE_PROVIDER_SITE_OTHER): Payer: Medicare Other | Admitting: Student in an Organized Health Care Education/Training Program

## 2020-08-22 VITALS — BP 125/80 | HR 111 | Ht 69.0 in | Wt 127.6 lb

## 2020-08-22 DIAGNOSIS — R630 Anorexia: Secondary | ICD-10-CM | POA: Insufficient documentation

## 2020-08-22 DIAGNOSIS — R0989 Other specified symptoms and signs involving the circulatory and respiratory systems: Secondary | ICD-10-CM | POA: Diagnosis not present

## 2020-08-22 DIAGNOSIS — Z23 Encounter for immunization: Secondary | ICD-10-CM

## 2020-08-22 DIAGNOSIS — D179 Benign lipomatous neoplasm, unspecified: Secondary | ICD-10-CM | POA: Insufficient documentation

## 2020-08-22 DIAGNOSIS — D171 Benign lipomatous neoplasm of skin and subcutaneous tissue of trunk: Secondary | ICD-10-CM

## 2020-08-22 DIAGNOSIS — R42 Dizziness and giddiness: Secondary | ICD-10-CM

## 2020-08-22 MED ORDER — DILTIAZEM HCL 30 MG PO TABS: 30.0000 mg | ORAL_TABLET | Freq: Every day | ORAL | 0 refills | Status: DC | PRN

## 2020-08-22 NOTE — Progress Notes (Signed)
SUBJECTIVE:   CHIEF COMPLAINT / HPI: dizziness, bump on back  Dizziness for 2 weeks intermittently. Mostly in the mornings when he wakes up. He thinks he fell over onto bed one time with no loss of consciousness. The room was spinning.  Has to grab hold of something to stay up, sensation lasts a few seconds and then can continue on normally. Only occurs when going from sitting to standing. Able to ambulate normally. The feeling has been improving. Has been taking diltiazem daily for the past 3 days PRN as directed by cardiology for atrial flutter. Feeling chest palpitations with these episodes but improved with medication.  Has had increased liquor consumption recently. Couple beers and a glass of wine per day. Partly blind. At home all day, not working. Couple joints of weed per day. Increased depression due to this.  Normal BMs and urination.   Lipoma- noticed it's presence several months ago and has not changed in shape/size to his knowledge. Does not bother him but just concerned as to what it could be.  Appetite stimulant- patient is concerned about his lack of appetite and wants to know how he can help gain weight. Drinks an Ensure every morning.   OBJECTIVE:   BP 125/80   Pulse (!) 111   Ht 5\' 9"  (1.753 m)   Wt 127 lb 9.6 oz (57.9 kg)   SpO2 100%   BMI 18.84 kg/m   General: NAD, pleasant, able to participate in exam Cardiac: RRR, normal heart sounds, no murmurs. 2+ radial and PT pulses bilaterally Respiratory: CTAB, normal effort, No wheezes, rales or rhonchi Back: positive for soft, mobile, non-tender lipoma approximately 5cm diameter superficial to right scapula. Extremities: no edema. WWP. Skin: warm and dry, no rashes noted Neuro: alert and oriented, no focal deficits Psych: Normal affect and mood  ASSESSMENT/PLAN:   Episodic lightheadedness 2 weeks of orthostatic lightheadedness without syncope.  Positive for palpitations consistent with prior a flutter  symptoms. Already seen by cardiology 9.28 and had normal check up including EKG.  Has been taking diltiazem PRN for symptoms as directed by cardiology for the past 3-4 days and has improved symptoms.  Asymptomatic at appointment today. Symptoms likely due to exacerbation of A. flutter in relation to increased alcohol consumption/dehydration.  - take diltiazem as directed by cardiology. 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 Continue to monitor and return if growing significantly or if it begins to cause a problem  Poor appetite Patient is concerned about poor appetite. He brings up this topic on his way out of the clinic.  We briefly discussed that his weight is stable with minor fluctuations over the past couple years which is reassuring.  Continue to drink ensure/boost supplements with meals Weed is appetite stimulant already using Checking basic labs today. May benefit from multivitamin. Discussed with patient that he has depression from being at home and not having a sense of purpose since he quit working for his disability. This would be good to address at a future appointment in depth for treatment options. I recommended to try to increase his socialization and activity level.  Can consider a medication stimulant   Adrenal mass found incidentally on 02/2019 when being evaluated for pancreatitis. Unsure if this was ever followed up on. Will discuss with patient when calling for lab results and the need for follow up adrenal CT.  Flu vaccine need Administered  today     Richarda Osmond, [place]

## 2020-08-22 NOTE — Assessment & Plan Note (Signed)
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 Continue to monitor and return if growing significantly or if it begins to cause a problem

## 2020-08-22 NOTE — Assessment & Plan Note (Addendum)
2 weeks of orthostatic lightheadedness without syncope.  Positive for palpitations consistent with prior a flutter symptoms. Already seen by cardiology 9.28 and had normal check up including EKG.  Has been taking diltiazem PRN for symptoms as directed by cardiology for the past 3-4 days and has improved symptoms.  Asymptomatic at appointment today. Symptoms likely due to exacerbation of A. flutter in relation to increased alcohol consumption/dehydration.  - take diltiazem as directed by cardiology. Refilled x30 - increase PO intake - BMP/CBC/TSH today - f/u with cardiology if not improving

## 2020-08-22 NOTE — Assessment & Plan Note (Addendum)
Patient is concerned about poor appetite. He brings up this topic on his way out of the clinic.  We briefly discussed that his weight is stable with minor fluctuations over the past couple years which is reassuring.  Continue to drink ensure/boost supplements with meals Weed is appetite stimulant already using Checking basic labs today. May benefit from multivitamin. Discussed with patient that he has depression from being at home and not having a sense of purpose since he quit working for his disability. This would be good to address at a future appointment in depth for treatment options. I recommended to try to increase his socialization and activity level.  Can consider a medication stimulant   Adrenal mass found incidentally on 02/2019 when being evaluated for pancreatitis. Unsure if this was ever followed up on. Will discuss with patient when calling for lab results and the need for follow up adrenal CT.

## 2020-08-22 NOTE — Assessment & Plan Note (Signed)
Administered today.

## 2020-08-22 NOTE — Patient Instructions (Signed)
It was a pleasure to see you today!  To summarize our discussion for this visit:  Your symptoms sound like they are related to low fluid status and/or your heart condition. You were recently seen at cardiology with a normal EKG. Today, we will check blood work to look for other abnormalities that could contribute.   I would recommend you increase your fluid intake, and decrease consumption of alcohol and weed as those can cause impairment worsening your symptoms.   You can continue to take your diltiazem as prescribed by your cardiologist if this is helping. I will refill today.  If you have another episode leading to passing out or are not getting better, please follow up with your cardiologist again or go to the Emergency department.  Some additional health maintenance measures we should update are: Health Maintenance Due  Topic Date Due  . COVID-19 Vaccine (1) Never done  . TETANUS/TDAP  Never done  . COLONOSCOPY  Never done  . INFLUENZA VACCINE  06/12/2020  .    Call the clinic at [phone] if your symptoms worsen or you have any concerns.   Thank you for allowing me to take part in your care,  Dr. Doristine Mango

## 2020-08-23 LAB — CBC
Hematocrit: 41.5 % (ref 37.5–51.0)
Hemoglobin: 15.3 g/dL (ref 13.0–17.7)
MCH: 37.2 pg — ABNORMAL HIGH (ref 26.6–33.0)
MCHC: 36.9 g/dL — ABNORMAL HIGH (ref 31.5–35.7)
MCV: 101 fL — ABNORMAL HIGH (ref 79–97)
Platelets: 230 10*3/uL (ref 150–450)
RBC: 4.11 x10E6/uL — ABNORMAL LOW (ref 4.14–5.80)
RDW: 12 % (ref 11.6–15.4)
WBC: 7.6 10*3/uL (ref 3.4–10.8)

## 2020-08-23 LAB — BASIC METABOLIC PANEL
BUN/Creatinine Ratio: 9 (ref 9–20)
BUN: 10 mg/dL (ref 6–24)
CO2: 22 mmol/L (ref 20–29)
Calcium: 10.3 mg/dL — ABNORMAL HIGH (ref 8.7–10.2)
Chloride: 102 mmol/L (ref 96–106)
Creatinine, Ser: 1.09 mg/dL (ref 0.76–1.27)
GFR calc Af Amer: 86 mL/min/{1.73_m2} (ref >59)
GFR calc non Af Amer: 74 mL/min/{1.73_m2} (ref >59)
Glucose: 79 mg/dL (ref 65–99)
Sodium: 140 mmol/L (ref 134–144)

## 2020-08-23 LAB — TSH: TSH: 1.2 u[IU]/mL (ref 0.450–4.500)

## 2020-09-30 ENCOUNTER — Other Ambulatory Visit: Payer: Self-pay | Admitting: Student in an Organized Health Care Education/Training Program

## 2022-07-28 ENCOUNTER — Ambulatory Visit (HOSPITAL_COMMUNITY)
Admission: EM | Admit: 2022-07-28 | Discharge: 2022-07-28 | Disposition: A | Discharge status: Home / Self Care | Payer: Medicare Other | Attending: Physician Assistant | Admitting: Physician Assistant

## 2022-07-28 ENCOUNTER — Encounter (HOSPITAL_COMMUNITY): Payer: Self-pay

## 2022-07-28 DIAGNOSIS — I10 Essential (primary) hypertension: Secondary | ICD-10-CM | POA: Diagnosis not present

## 2022-07-28 MED ORDER — DILTIAZEM HCL 30 MG PO TABS: 30.0000 mg | ORAL_TABLET | Freq: Every day | ORAL | 2 refills | Status: AC | PRN

## 2022-07-28 NOTE — ED Triage Notes (Signed)
Patient has not been feeling well for 2 weeks. States he ate a lot of BBQ and is no longer on BP medication. Patient having dizziness(room spinning) and mild headaches. Patient is legally blind but noticed more cloudiness in the left eye than normal.

## 2022-07-28 NOTE — ED Provider Notes (Signed)
[facility]    CSN: [id number] Arrival date & time: 07/28/22  1006      History   Chief Complaint Chief Complaint  Patient presents with   Hypertension    HPI Joel Baker is a 61 y.o. male.   61 year old male presents with elevated blood pressure.  Patient indicates that he has a history of having high blood pressure and was taking Cardizem 30 mg daily but he stopped this medicine about a year ago.  Patient indicates that over the past week he has been having some intermittent dizziness, off-balance sensation, mild headache.  He relates that this dizziness lasted for several days but it has improved over the past 1 to 2 days.  He indicates that his wife took his blood pressure yesterday and he relates the reading was 181/140, he relates he was really feeling bad yesterday but today he is feeling better.  He indicates that he knows that he needs to restart his blood pressure medicine but he did not have any at the present time.  He indicates that most of his symptoms may be due to his blood pressure being elevated.  He denies any chest pain, shortness of breath, nausea or vomiting.  Patient denies any fever or chills.  He does relate that he has a PCP but he has not seen her in the past year.   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 No pertinent surgical history.     Home Medications    Prior to Admission medications   Medication Sig Start Date End Date Taking? Authorizing Provider  atropine 1 % ophthalmic solution Place 1 drop into both eyes  daily. 07/01/19   [provider]  diltiazem (CARDIZEM) 30 MG tablet Take 1 tablet (30 mg total) by mouth daily as needed. 07/28/22   Nyoka Lint, PA-C  thiamine (VITAMIN B-1) 100 MG tablet TAKE 1 TABLET BY MOUTH EVERY DAY 07/01/19   Meccariello, Bernita Raisin, MD    Family History Family History  Problem Relation Age of Onset   Diabetes Mother    Diabetes Sister    Diabetes Brother    Diabetes Brother     Social History Social History   Tobacco Use   Smoking status: Every Day   Smokeless tobacco: Never  Vaping Use   Vaping Use: Never used  Substance Use Topics   Alcohol use: Yes    Comment: daily   Drug use: Yes    Types: Marijuana    Comment: every other day     Allergies   Patient has no known allergies.   Review of Systems Review of Systems  Neurological:  Positive for dizziness.     Physical Exam Triage Vital Signs ED Triage Vitals  Enc Vitals Group     BP 07/28/22 1050 (!) 150/104     Pulse Rate 07/28/22 1050 91     Resp 07/28/22 1050 16     Temp 07/28/22 1050 99.6 F (37.6 C)     Temp Source 07/28/22 1050 Oral  SpO2 07/28/22 1050 100 %     Weight --      Height --      Head Circumference --      Peak Flow --      Pain Score 07/28/22 1052 0     Pain Loc --      Pain Edu? --      Excl. in [place]? --    No data found.  Updated Vital Signs BP (!) 150/104 (BP Location: Left Arm)   Pulse 91   Temp 99.6 F (37.6 C) (Oral)   Resp 16   SpO2 100%   Visual Acuity Right Eye Distance:   Left Eye Distance:   Bilateral Distance:    Right Eye Near:   Left Eye Near:    Bilateral Near:     Physical Exam Constitutional:      Appearance: Normal appearance.  HENT:     Right Ear: Tympanic membrane and ear canal normal.     Left Ear: Tympanic membrane and ear canal normal.     Mouth/Throat:     Mouth: Mucous membranes are moist.     Pharynx: Oropharynx is clear. Uvula midline.  Neck:     Vascular: No carotid bruit.  Cardiovascular:     Rate  and Rhythm: Normal rate and regular rhythm.     Heart sounds: Normal heart sounds.  Pulmonary:     Effort: Pulmonary effort is normal.     Breath sounds: Normal breath sounds and air entry. No wheezing, rhonchi or rales.  Lymphadenopathy:     Cervical: No cervical adenopathy.  Neurological:     Mental Status: He is alert.     Cranial Nerves: Cranial nerves 2-12 are intact.     Motor: Motor function is intact.     Coordination: Coordination is intact.      UC Treatments / Results  Labs (all labs ordered are listed, but only abnormal results are displayed) Labs Reviewed - No data to display  EKG   Radiology No results found.  Procedures Procedures (including critical care time)  Medications Ordered in UC Medications - No data to display  Initial Impression / Assessment and Plan / UC Course  I have reviewed the triage vital signs and the nursing notes.  Pertinent labs & imaging results that were available during my care of the patient were reviewed by me and considered in my medical decision making (see chart for details).       Plan: 1.  Advised to restart the Cardizem 30 mg daily. 2.  Advised to follow-up with PCP in the next 3 to 4 weeks to have blood pressure rechecked and medication monitored. 3.  Advised to return to urgent care as needed. Final Clinical Impressions(s) / UC Diagnoses   Final diagnoses:  Essential hypertension     Discharge Instructions      Advised to restart the Cardizem and take 1 tablet daily to help control blood pressure. Advised to follow-up with their PCP in the next 3 to 4 weeks to have blood pressure evaluated. Advised to return to urgent care as needed.     ED Prescriptions     Medication Sig Dispense Auth. Provider   diltiazem (CARDIZEM) 30 MG tablet Take 1 tablet (30 mg total) by mouth daily as needed. 30 tablet Nyoka Lint, PA-C      PDMP not reviewed this encounter.   Nyoka Lint, PA-C 07/28/22 1127

## 2022-07-28 NOTE — Discharge Instructions (Signed)
Advised to restart the Cardizem and take 1 tablet daily to help control blood pressure. Advised to follow-up with their PCP in the next 3 to 4 weeks to have blood pressure evaluated. Advised to return to urgent care as needed.
